# Patient Record
Sex: Female | Born: 1970 | Race: White | Hispanic: Yes | Smoking: Former smoker
Health system: Southern US, Community
[De-identification: ages and names within clinical notes are randomized; demographics above are authoritative.]

## PROBLEM LIST (undated history)

## (undated) DIAGNOSIS — K219 Gastro-esophageal reflux disease without esophagitis: Secondary | ICD-10-CM

## (undated) DIAGNOSIS — R519 Headache, unspecified: Secondary | ICD-10-CM

## (undated) DIAGNOSIS — K649 Unspecified hemorrhoids: Secondary | ICD-10-CM

## (undated) DIAGNOSIS — T8859XA Other complications of anesthesia, initial encounter: Secondary | ICD-10-CM

## (undated) DIAGNOSIS — Z8742 Personal history of other diseases of the female genital tract: Secondary | ICD-10-CM

## (undated) DIAGNOSIS — J302 Other seasonal allergic rhinitis: Secondary | ICD-10-CM

## (undated) DIAGNOSIS — R3915 Urgency of urination: Secondary | ICD-10-CM

## (undated) DIAGNOSIS — R350 Frequency of micturition: Secondary | ICD-10-CM

## (undated) DIAGNOSIS — G8929 Other chronic pain: Secondary | ICD-10-CM

## (undated) DIAGNOSIS — M545 Low back pain, unspecified: Secondary | ICD-10-CM

## (undated) DIAGNOSIS — E785 Hyperlipidemia, unspecified: Secondary | ICD-10-CM

## (undated) DIAGNOSIS — R0683 Snoring: Secondary | ICD-10-CM

## (undated) DIAGNOSIS — R51 Headache: Secondary | ICD-10-CM

## (undated) DIAGNOSIS — M5126 Other intervertebral disc displacement, lumbar region: Secondary | ICD-10-CM

## (undated) DIAGNOSIS — R918 Other nonspecific abnormal finding of lung field: Secondary | ICD-10-CM

## (undated) DIAGNOSIS — N739 Female pelvic inflammatory disease, unspecified: Secondary | ICD-10-CM

## (undated) DIAGNOSIS — T7840XA Allergy, unspecified, initial encounter: Secondary | ICD-10-CM

## (undated) DIAGNOSIS — R6882 Decreased libido: Secondary | ICD-10-CM

## (undated) DIAGNOSIS — F419 Anxiety disorder, unspecified: Secondary | ICD-10-CM

## (undated) DIAGNOSIS — N3281 Overactive bladder: Secondary | ICD-10-CM

## (undated) DIAGNOSIS — R7303 Prediabetes: Secondary | ICD-10-CM

## (undated) DIAGNOSIS — E8940 Asymptomatic postprocedural ovarian failure: Secondary | ICD-10-CM

## (undated) DIAGNOSIS — R7982 Elevated C-reactive protein (CRP): Secondary | ICD-10-CM

## (undated) HISTORY — PX: TONSILLECTOMY: SUR1361

## (undated) HISTORY — PX: SPINE SURGERY: SHX786

## (undated) HISTORY — DX: Headache, unspecified: R51.9

## (undated) HISTORY — PX: TONSILLECTOMY AND ADENOIDECTOMY: SUR1326

## (undated) HISTORY — DX: Allergy, unspecified, initial encounter: T78.40XA

## (undated) HISTORY — DX: Gastro-esophageal reflux disease without esophagitis: K21.9

## (undated) HISTORY — DX: Anxiety disorder, unspecified: F41.9

## (undated) HISTORY — DX: Other seasonal allergic rhinitis: J30.2

## (undated) HISTORY — DX: Elevated C-reactive protein (CRP): R79.82

## (undated) HISTORY — PX: BUNIONECTOMY: SHX129

## (undated) HISTORY — DX: Snoring: R06.83

## (undated) HISTORY — PX: BREAST SURGERY: SHX581

## (undated) HISTORY — PX: HEMORRHOID BANDING: SHX5850

## (undated) HISTORY — PX: ABDOMINAL ADHESION SURGERY: SHX90

## (undated) HISTORY — DX: Female pelvic inflammatory disease, unspecified: N73.9

## (undated) HISTORY — DX: Asymptomatic postprocedural ovarian failure: E89.40

## (undated) HISTORY — DX: Other intervertebral disc displacement, lumbar region: M51.26

## (undated) HISTORY — DX: Hyperlipidemia, unspecified: E78.5

## (undated) HISTORY — PX: BILATERAL SALPINGOOPHORECTOMY: SHX1223

## (undated) HISTORY — DX: Prediabetes: R73.03

## (undated) HISTORY — DX: Headache: R51

## (undated) HISTORY — DX: Decreased libido: R68.82

---

## 1988-10-16 HISTORY — PX: CERVICAL CONIZATION W/BX: SHX1330

## 1993-10-16 HISTORY — PX: EXPLORATORY LAPAROTOMY: SUR591

## 1998-04-27 ENCOUNTER — Other Ambulatory Visit: Admission: RE | Admit: 1998-04-27 | Discharge: 1998-04-27 | Payer: Self-pay | Admitting: Gynecology

## 1999-05-06 ENCOUNTER — Other Ambulatory Visit: Admission: RE | Admit: 1999-05-06 | Discharge: 1999-05-06 | Payer: Self-pay | Admitting: Gynecology

## 2000-04-20 ENCOUNTER — Other Ambulatory Visit: Admission: RE | Admit: 2000-04-20 | Discharge: 2000-04-20 | Payer: Self-pay | Admitting: Gynecology

## 2000-10-16 HISTORY — PX: BREAST ENHANCEMENT SURGERY: SHX7

## 2001-09-24 ENCOUNTER — Other Ambulatory Visit: Admission: RE | Admit: 2001-09-24 | Discharge: 2001-09-24 | Payer: Self-pay | Admitting: Gynecology

## 2002-02-11 ENCOUNTER — Encounter: Admission: RE | Admit: 2002-02-11 | Discharge: 2002-02-11 | Payer: Self-pay | Admitting: Gynecology

## 2002-02-11 ENCOUNTER — Encounter: Payer: Self-pay | Admitting: Gynecology

## 2002-03-25 ENCOUNTER — Encounter: Payer: Self-pay | Admitting: Family Medicine

## 2002-03-25 ENCOUNTER — Encounter: Admission: RE | Admit: 2002-03-25 | Discharge: 2002-03-25 | Payer: Self-pay | Admitting: Family Medicine

## 2002-04-16 ENCOUNTER — Encounter: Payer: Self-pay | Admitting: Family Medicine

## 2002-04-16 ENCOUNTER — Encounter: Admission: RE | Admit: 2002-04-16 | Discharge: 2002-04-16 | Payer: Self-pay | Admitting: Family Medicine

## 2002-06-13 ENCOUNTER — Ambulatory Visit (HOSPITAL_COMMUNITY): Admission: RE | Admit: 2002-06-13 | Discharge: 2002-06-14 | Payer: Self-pay | Admitting: Neurosurgery

## 2002-06-13 ENCOUNTER — Encounter: Payer: Self-pay | Admitting: Neurosurgery

## 2002-06-13 HISTORY — PX: LUMBAR LAMINECTOMY/DECOMPRESSION MICRODISCECTOMY: SHX5026

## 2002-09-29 ENCOUNTER — Other Ambulatory Visit: Admission: RE | Admit: 2002-09-29 | Discharge: 2002-09-29 | Payer: Self-pay | Admitting: Gynecology

## 2003-05-13 ENCOUNTER — Encounter: Admission: RE | Admit: 2003-05-13 | Discharge: 2003-05-13 | Payer: Self-pay | Admitting: Gynecology

## 2003-05-13 ENCOUNTER — Encounter: Payer: Self-pay | Admitting: Gynecology

## 2003-05-21 ENCOUNTER — Encounter: Payer: Self-pay | Admitting: Gynecology

## 2003-05-21 ENCOUNTER — Encounter: Admission: RE | Admit: 2003-05-21 | Discharge: 2003-05-21 | Payer: Self-pay | Admitting: Gynecology

## 2003-07-01 ENCOUNTER — Encounter (INDEPENDENT_AMBULATORY_CARE_PROVIDER_SITE_OTHER): Payer: Self-pay | Admitting: *Deleted

## 2003-07-01 ENCOUNTER — Observation Stay (HOSPITAL_COMMUNITY): Admission: RE | Admit: 2003-07-01 | Discharge: 2003-07-02 | Payer: Self-pay | Admitting: Gynecology

## 2003-07-01 HISTORY — PX: LAPAROTOMY: SHX154

## 2003-10-07 ENCOUNTER — Other Ambulatory Visit: Admission: RE | Admit: 2003-10-07 | Discharge: 2003-10-07 | Payer: Self-pay | Admitting: Gynecology

## 2004-01-07 ENCOUNTER — Emergency Department (HOSPITAL_COMMUNITY): Admission: EM | Admit: 2004-01-07 | Discharge: 2004-01-07 | Payer: Self-pay | Admitting: Emergency Medicine

## 2004-06-21 ENCOUNTER — Emergency Department (HOSPITAL_COMMUNITY): Admission: EM | Admit: 2004-06-21 | Discharge: 2004-06-21 | Payer: Self-pay | Admitting: Emergency Medicine

## 2004-11-07 ENCOUNTER — Other Ambulatory Visit: Admission: RE | Admit: 2004-11-07 | Discharge: 2004-11-07 | Payer: Self-pay | Admitting: Gynecology

## 2005-07-10 ENCOUNTER — Emergency Department (HOSPITAL_COMMUNITY): Admission: EM | Admit: 2005-07-10 | Discharge: 2005-07-11 | Payer: Self-pay | Admitting: Emergency Medicine

## 2005-07-13 ENCOUNTER — Encounter: Admission: RE | Admit: 2005-07-13 | Discharge: 2005-07-13 | Payer: Self-pay | Admitting: Gynecology

## 2005-10-18 ENCOUNTER — Other Ambulatory Visit: Admission: RE | Admit: 2005-10-18 | Discharge: 2005-10-18 | Payer: Self-pay | Admitting: Gynecology

## 2006-10-16 HISTORY — PX: ABDOMINAL HYSTERECTOMY: SHX81

## 2007-01-29 ENCOUNTER — Other Ambulatory Visit: Admission: RE | Admit: 2007-01-29 | Discharge: 2007-01-29 | Payer: Self-pay | Admitting: Gynecology

## 2007-06-03 ENCOUNTER — Inpatient Hospital Stay (HOSPITAL_COMMUNITY): Admission: AD | Admit: 2007-06-03 | Discharge: 2007-06-03 | Payer: Self-pay | Admitting: Gynecology

## 2007-08-26 ENCOUNTER — Encounter (INDEPENDENT_AMBULATORY_CARE_PROVIDER_SITE_OTHER): Payer: Self-pay | Admitting: Gynecology

## 2007-08-26 ENCOUNTER — Inpatient Hospital Stay (HOSPITAL_COMMUNITY): Admission: RE | Admit: 2007-08-26 | Discharge: 2007-08-27 | Payer: Self-pay | Admitting: Gynecology

## 2007-08-27 HISTORY — PX: SUPRACERVICAL ABDOMINAL HYSTERECTOMY: SHX5393

## 2009-07-27 ENCOUNTER — Encounter: Admission: RE | Admit: 2009-07-27 | Discharge: 2009-07-27 | Payer: Self-pay | Admitting: Gynecology

## 2010-10-06 ENCOUNTER — Emergency Department (HOSPITAL_COMMUNITY)
Admission: EM | Admit: 2010-10-06 | Discharge: 2010-10-06 | Payer: Self-pay | Source: Home / Self Care | Admitting: Emergency Medicine

## 2010-10-11 ENCOUNTER — Encounter: Payer: Self-pay | Admitting: Cardiology

## 2010-10-11 ENCOUNTER — Ambulatory Visit: Payer: Self-pay

## 2010-10-11 ENCOUNTER — Encounter (HOSPITAL_COMMUNITY)
Admission: RE | Admit: 2010-10-11 | Discharge: 2010-11-15 | Payer: Self-pay | Source: Home / Self Care | Attending: Internal Medicine | Admitting: Internal Medicine

## 2010-10-12 ENCOUNTER — Ambulatory Visit: Payer: Self-pay

## 2010-11-06 ENCOUNTER — Encounter: Payer: Self-pay | Admitting: Emergency Medicine

## 2010-11-17 NOTE — Assessment & Plan Note (Signed)
Summary: Cardiology Nuclear Testing  Nuclear Med Background Indications for Stress Test: Evaluation for Ischemia, Post Hospital  Indications Comments: 10/06/10- [facility]- Chest pain.  History: GXT  History Comments:  ~5 yrs ago GXT:OK per patient.  Symptoms: Chest Pain, Chest Pain with Exertion, DOE, Fatigue, Nausea  Symptoms Comments: CP>(L)arm. Last episode of GN:FAOZHYQMV.   Nuclear Pre-Procedure Cardiac Risk Factors: Lipids, Smoker Caffeine/Decaff Intake: none NPO After: 10:00 AM Lungs: Clear IV 0.9% NS with Angio Cath: 22g     IV Site: L Hand IV Started by: Cathlyn Parsons, RN Chest Size (in) 38     Cup Size D     Height (in): 62 Weight (lb): 167 BMI: 30.66  Nuclear Med Study 1 or 2 day study:  1 day     Stress Test Type:  Stress Reading MD:  Willa Rough, MD     Referring MD:  Arvilla Meres, MD Resting Radionuclide:  Technetium 73m Tetrofosmin     Resting Radionuclide Dose:  10 mCi  Stress Radionuclide:  Technetium 19m Tetrofosmin     Stress Radionuclide Dose:  33 mCi   Stress Protocol Exercise Time (min):  9:00 min     Max HR:  160 bpm     Predicted Max HR:  179 bpm  Max Systolic BP: 161 mm Hg     Percent Max HR:  89.39 %     METS: 10.4 Rate Pressure Product:  78469    Stress Test Technologist:  [employer], CMA-N     Nuclear Technologist:  Doyne Keel, CNMT  Rest Procedure  Myocardial perfusion imaging was performed at rest 45 minutes following the intravenous administration of Technetium 22m Tetrofosmin.  Stress Procedure  The patient exercised for nine minutes.  The patient stopped due to fatigue.  She c/o chest pain, 1-2/10, with exercise.  There were nonspecific ST-T wave changes and a rare PVC.  Technetium 69m Tetrofosmin was injected at peak exercise and myocardial perfusion imaging was performed after a brief delay.  QPS Raw Data Images:  Normal; no motion artifact; normal heart/lung ratio. Stress Images:  Anterior breast attenuation Rest Images:   Same as stress Subtraction (SDS):  No evidence of ischemia. Transient Ischemic Dilatation:  0.89  (Normal <1.22)  Lung/Heart Ratio:  0.27  (Normal <0.45)  Quantitative Gated Spect Images QGS EDV:  37 ml QGS ESV:  3 ml QGS EF:  93 % QGS cine images:  Motion is normal. The quantitative analysis computes a very high EF because the LV is small.  Findings Normal nuclear study      Overall Impression  Exercise Capacity: Fair exercise capacity. BP Response: Normal blood pressure response. Clinical Symptoms: chest pain ECG Impression: No significant ST segment change suggestive of ischemia. Overall Impression: Normal stress nuclear study with breast attenuation. Overall Impression Comments: The LV is small  Appended Document: Cardiology Nuclear Testing ok  Appended Document: Cardiology Nuclear Testing pt aware

## 2010-12-26 LAB — CBC
HCT: 38 % (ref 36.0–46.0)
Hemoglobin: 13.1 g/dL (ref 12.0–15.0)
MCH: 29.2 pg (ref 26.0–34.0)
MCHC: 34.5 g/dL (ref 30.0–36.0)
MCV: 84.8 fL (ref 78.0–100.0)
Platelets: 258 10*3/uL (ref 150–400)
RBC: 4.48 MIL/uL (ref 3.87–5.11)
RDW: 13.8 % (ref 11.5–15.5)
WBC: 10.8 10*3/uL — ABNORMAL HIGH (ref 4.0–10.5)

## 2010-12-26 LAB — URINALYSIS, ROUTINE W REFLEX MICROSCOPIC
Bilirubin Urine: NEGATIVE
Glucose, UA: NEGATIVE mg/dL
Hgb urine dipstick: NEGATIVE
Ketones, ur: NEGATIVE mg/dL
Nitrite: NEGATIVE
Protein, ur: NEGATIVE mg/dL
Specific Gravity, Urine: 1.025 (ref 1.005–1.030)
Urobilinogen, UA: 0.2 mg/dL (ref 0.0–1.0)
pH: 6.5 (ref 5.0–8.0)

## 2010-12-26 LAB — POCT CARDIAC MARKERS
CKMB, poc: <1 ng/mL — ABNORMAL LOW (ref 1.0–8.0)
Myoglobin, poc: 41.8 ng/mL (ref 12–200)
Troponin i, poc: 0.06 ng/mL (ref 0.00–0.09)

## 2010-12-26 LAB — BASIC METABOLIC PANEL
BUN: 8 mg/dL (ref 6–23)
CO2: 27 mEq/L (ref 19–32)
Calcium: 9 mg/dL (ref 8.4–10.5)
Chloride: 102 mEq/L (ref 96–112)
Creatinine, Ser: 0.44 mg/dL (ref 0.4–1.2)
GFR calc Af Amer: >60 mL/min (ref >60)
GFR calc non Af Amer: >60 mL/min (ref >60)
Glucose, Bld: 114 mg/dL — ABNORMAL HIGH (ref 70–99)
Potassium: 3.5 mEq/L (ref 3.5–5.1)
Sodium: 137 mEq/L (ref 135–145)

## 2010-12-26 LAB — DIFFERENTIAL
Basophils Absolute: 0 10*3/uL (ref 0.0–0.1)
Basophils Relative: 0 % (ref 0–1)
Eosinophils Absolute: 0.1 10*3/uL (ref 0.0–0.7)
Eosinophils Relative: 1 % (ref 0–5)
Lymphocytes Relative: 41 % (ref 12–46)
Lymphs Abs: 4.4 10*3/uL — ABNORMAL HIGH (ref 0.7–4.0)
Monocytes Absolute: 0.5 10*3/uL (ref 0.1–1.0)
Monocytes Relative: 5 % (ref 3–12)
Neutro Abs: 5.6 10*3/uL (ref 1.7–7.7)
Neutrophils Relative %: 53 % (ref 43–77)

## 2010-12-26 LAB — TSH: TSH: 0.766 u[IU]/mL (ref 0.350–4.500)

## 2010-12-26 LAB — T4, FREE: Free T4: 0.91 ng/dL (ref 0.80–1.80)

## 2010-12-26 LAB — BRAIN NATRIURETIC PEPTIDE: Pro B Natriuretic peptide (BNP): <30 pg/mL (ref 0.0–100.0)

## 2011-02-28 NOTE — H&P (Signed)
[name], [name]            ACCOUNT NO.:  [id number]   MEDICAL RECORD NO.:  [id number]          PATIENT TYPE:  INP   LOCATION:  NA                           FACILITY:  [facility]   PHYSICIAN:  Gretta Cool, M.D. DATE OF BIRTH:  1971/04/13   DATE OF ADMISSION:  08/26/2007  DATE OF DISCHARGE:                              HISTORY & PHYSICAL   CHIEF COMPLAINT:  Cyclic pelvic pain.   HISTORY OF PRESENT ILLNESS:  This is a 40 year old, nulligravida with  known severe pelvic inflammatory disease.  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  She has been placed on  suppressive therapy, but has failed to comply.  She has recurrence of  increasingly severe cyclic pelvic pain any time she is off suppressive  therapy.  She is now admitted for definitive therapy now that she has  made a decision not to consider IVF and advanced reproductive  technologies.  She has tried on her own without success and does not  wish to go to assisted reproductive technologies.  It appears that she  has now decided to proceed to definitive therapy.  She understands the  risks, benefits, options and alternatives to all.  She also understands  the need for hormone replacement therapy.   PAST MEDICAL HISTORY:  1. Usual childhood disease without sequelae.  2. Impaired glucose intolerance.  3. Polycystic ovary syndrome.  4. Metabolic syndrome with dyslipidemia.   She is referred to Dr. Lucianne Muss for management of her metabolic syndrome  and impaired glucose intolerance.   FAMILY HISTORY:  Mother and father both living and well.  Mom has  hypercholesterolemia.  Maternal aunt and maternal grandfather had cancer  of unknown type.   PAST SURGICAL HISTORY:  1. History of conization biopsy in 1990, by Dr. Rosalia Hammers.  2. History of right oophorocystectomy by Dr. Rosalia Hammers in 1995.  3. Bilateral breast augmentation in 2002.  4. Diagnostic laparoscopy.  5. Exploratory laparotomy.  6. Salpingo-lysis.  7. Left ovarian cystectomy.  8. Lysis of extensive adhesions in 2004.  At that time, she was      advised to begin ovarian suppressive therapy to prevent cyclic      pelvic pain.  She was also informed that she would eventually need      definitive therapy by hysterectomy.  She is now admitted for that      therapy.   REVIEW OF SYSTEMS:  HEENT:  Denies symptoms.  CARDIOPULMONARY:  Denies  asthma, cough, bronchitis, shortness of breath.  GI/GU:  Denies  frequency, dysuria, change in bowel habits and food intolerance.   PHYSICAL EXAMINATION:  GENERAL:  Well-developed, well-nourished, white  female.  She is significantly over ideal weight at 160 pounds, at 5 feet  2 inches.  VITAL SIGNS:  Blood pressure 112/68.  HEENT:  Pupils equal round and reactive to light and accommodation.  Fundi benign.  Oropharynx clear.  NECK:  Supple without mass or thyroid enlargement.  CHEST:  Clear to P&A.  BREASTS:  Bilateral augmentation without mass, nodes or nipple  discharge.  PELVIC:  External genitalia, normal female.  Vagina  clean and rugous.  Cervix is nulliparous and clean.  Uterus normal size, shape and contour.  Adnexa tender bilaterally with no dominant mass.  Rectovaginal exam  confirms.  EXTREMITIES:  Negative.  NEUROLOGIC:  Physiologic.   IMPRESSION:  Incapacitating cyclic pelvic pain secondary to pelvic  inflammatory disease with extensive pelvic adhesions and bilateral  trapped ovary syndrome.   PLAN:  Supracervical hysterectomy and possible bilateral salpingo-  oophorectomy, lysis of extensive adhesions.  She understands the  possible need for hormone replacement therapy for acute postsurgical  menopause.  She also understands the risks and benefits of the procedure  with risk of adjacent organ injury, infection, thrombosis, etc.  She is  now admitted for definitive therapy.           ______________________________  Gretta Cool, M.D.     CWL/MEDQ  D:  08/25/2007  T:   08/26/2007  Job:  [id number]   cc:   Reather Littler, M.D.  Fax: [phone]

## 2011-02-28 NOTE — Op Note (Signed)
NAMESEVANA, [name]            ACCOUNT NO.:  [id number]   MEDICAL RECORD NO.:  [id number]          PATIENT TYPE:  INP   LOCATION:  1534                         FACILITY:  [facility]   PHYSICIAN:  Gretta Cool, M.D. DATE OF BIRTH:  1971/06/08   DATE OF PROCEDURE:  DATE OF DISCHARGE:  08/27/2007                               OPERATIVE REPORT   PREOPERATIVE DIAGNOSES:  1. 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  2. Multiple conservative surgeries for extensive pelvic inflammatory      disease.   POSTOPERATIVE DIAGNOSES:  1. 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  2. Multiple conservative surgeries for extensive pelvic inflammatory      disease.   PROCEDURE:  Super cervical hysterectomy, left salpingo-oophorectomy,  lysis of extensive adhesions, revision of her multiple abdominal scars.   SURGEON:  Gretta Cool, M.D.   ASSISTANT:  Almedia Balls. Randell Patient, M.D.   ANESTHESIA:  General orotracheal.   DESCRIPTION OF PROCEDURE:  The patient was prepped and draped in the  lithotomy position.  An incision was begun by excision of her previous  Pfannenstiel incisions.  A wide resection around the incisions was made,  and the intervening scar excised.  The resection was carried down to the  level of Scarpa's fascia.  At this point, the incision was further  incised to the fascia.  At this point, the fascia was opened, and the  rectus muscles separated in the midline.  At this point, the peritoneum  was identified and opened sharply.  The peritoneum was then opened  fully, and the abdomen explored.  There were no abnormalities notified  in the upper abdomen.  Examination of the pelvis, however, revealed  extensive adhesions of the fallopian tube and ovary to the omentum and  to the lateral pelvic wall.  These adhesions were lysed by blunt and  sharp dissection.  The cautery on cut was used to lyse most of the  adhesions and to mobilize the ovary from its  attachments, then lift it  up out of the pelvis.  At this point, the adnexal structures were  clamped across with [facility] clamps, and the uterus and tubes elevated.  The round ligaments were then transected by cautery.  The anterior  length of the broad ligament was then opened on each side.  On the left,  the infundibulopelvic vessels were isolated from the ureter, clamped,  cut, sutured, and tied with 0 Vicryl.  The uterine vessels on each side  were then skeletonized, clamped, cut, sutured, and tied with 0 Vicryl.  On the right, a further pedicle was taken, including the upper portion  of the cardinal ligament.  At this point, the cervix was incised in a  reverse cone fashion, so as to remove virtually all of the endocervical  canal.  Bleeding points were cauterized.  The cervix was then closed  with a running suture of 0 Vicryl.  At this point, the pelvis was dry.  The pelvic floors were reperitonealized with a running suture of 2-0  Monocryl.  At this point, irrigation was used to remove clot and debris.  There was no residual bleeding.  At this point, closure of the abdominal  wall was undertaken.  The peritoneum was closed with a running suture of  0 Monocryl.  The posterior sheath, rectus sheath, and the rectus muscles  were then plicated in the midline with a running suture of 0 Monocryl as  well.  At this point, the fascia was approximated centrally with  interrupted suture of 0 Vicryl and then closed from each angle to the  midline with a running suture of 0 Vicryl.  To the right of the midline,  there was noted to be a significant fascial weakness, apparently from  some separation of the fascia from previous procedures.  The fascia was  secured to the fascia still attached to the pubic  arch.  At this point, the subcutaneous tissues were approximated with  interrupted sutures of 3-0 Vicryl, and the skin closed with skin staples  and Steri-Strips.  At the end of the procedure,  sponge and lap counts  were correct.  There were no complications.  Patient returned to the  recovery room in excellent condition.           ______________________________  Gretta Cool, M.D.     CWL/MEDQ  D:  08/26/2007  T:  08/27/2007  Job:  [id number]   cc:   Almedia Balls. Randell Patient, M.D.  Fax: [phone]

## 2011-03-03 NOTE — Op Note (Signed)
NAMELESLEYANNE, POLITTE                      ACCOUNT NO.:  [id number]   MEDICAL RECORD NO.:  [id number]                   PATIENT TYPE:  OBV   LOCATION:  0445                                 [facility]ACILITY:  [facility]   PHYSICIAN:  Gretta Cool, M.D.              DATE OF BIRTH:  December 09, 1970   DATE OF PROCEDURE:  07/01/2003  DATE OF DISCHARGE:                                 OPERATIVE REPORT   PREOPERATIVE DIAGNOSES:  1. Left adnexal mass; complex, persistent, solid, and cystic areas     suspicious for neoplasm.  2. Previous history of right ovarian serous cystadenoma in 1995.  3. Cyclic pelvic pain.  4. Abnormal uterine bleeding.   POSTOPERATIVE DIAGNOSIS:  Trapped left ovary with severe tubal adhesions  bilateral and complete atrophy of her right ovary with no visible evidence  of persisting right ovary.  Severe scaring left ovary and fallopian tubes.   SURGEON:  Gretta Cool, M.D.   ASSISTANT:  Raynald Kemp, M.D.   ANESTHESIA:  General orotracheal.   DESCRIPTION OF PROCEDURE:  Under excellent general anesthesia with the  patient's abdomen prepped and draped as a sterile field in Brashear stirrups  with a tubal dye injection device attached to her cervix.  A subumbilical  incision was made and Veress cannula introduced after adequate  pneumoperitoneum with carbon dioxide.  The laparoscope trocar was introduced  and pelvic organs visualized.  There was significant adhesion of the cecum  to the anterior abdominal wall above the right ovary. Omental adhesions were  also present.  Examination of the fallopian tube and ovary on the right  revealed no evidence of residual ovarian tissue and complete phimosis and  fixation of the distal fimbriated end of the fallopian tube to the pelvic  sidewall.  On the left there was a complete obliteration of the fallopian  tube opening and a tuboovarian complex formation.  There was no visible  fimbriated end that was plastered to the ovary.   The entire ovary was  covered by fallopian tube and mesentery.  There was no evidence of recent or  remote ovulation and very little ovarian surface that was not covered by  fallopian tube.  There was no significant evidence of endometriosis or other  pathology.  The adhesions were so dense that it was very difficult to  mobilize the ovary on the left at all.  Because of the severity of adhesions  decision was made to proceed to laparotomy for definitive diagnosis of the  ovarian pathology and for removal without rupture of the mass.   At this point a Pfannenstiel incision was made by excision of her previous  scar.  Excision was extended through the fascia.  The peritoneum was opened  and the abdomen explored.  The findings noted by laparoscopy were confirmed.  The adhesions of the cecum to the anterior abdominal wall were excised.  Examination of the left ovary and tube  revealed advanced adhesion of the  fallopian tube over the entire surface of the ovary except for a small  portion medially.  At this point decision was made to dissect the fallopian  tube free so as to free the surface of the ovary.  Dissection was carried  out by blunt and sharp dissection and by needle cautery.  The ovary had  multiple loculations and was quite enlarged to approximately 4 x 7 cm.  There was evidence of recent hemorrhage and a corpus luteum cyst in several  areas and evidence of trapped ovary function with failure of the eggs to  release from the ovary itself.  The fallopian tube was very densely  adherent.  It was dissected off with considerable difficulty because of the  severity and density of adhesions.  Once the fallopian tube was mobilized  the ovarian cysts were shelled out after incision of the ovarian capsule.  All of the multiple ovarian cysts seemed to be corpus luteum cyst with  hemorrhage inside and organization of the clot; three or four such  structures were identified and evacuated.   There was no other evidence of  ovarian pathology once those cystic structures were excised.  The ovary was  then repaired with a running subcuticular closure of 5-0 Vicryl.  The  mesosalpinx was then pulled across the raw surface on the fallopian tube  which had been removed from the surface of the ovary.  The omentum was  placed between the ovary and fallopian tube in hopes of preventing re-  adhesion.  At this point the pelvis was irrigated to remove all debris.  The  abdominal peritoneum was then closed with a running suture of 0 Monocryl.  The rectus muscle was then plicated in the midline with a running suture of  0 Monocryl.  The fascia was then approximated from each angle to the midline  with a running suture of 0 Vicryl.  The subcu was approximated with  interrupted sutures of 3-0 Vicryl and the skin closed with skin staples and  Steri-Strips.  At the end of the procedure, sponge and lap counts were  correct and no complications.  The patient returned to the recovery room in  excellent condition.                                               Gretta Cool, M.D.    CWL/MEDQ  D:  07/01/2003  T:  07/01/2003  Job:  [id number]

## 2011-03-03 NOTE — H&P (Signed)
[name], [name]                      ACCOUNT NO.:  [id number]   MEDICAL RECORD NO.:  [id number]                   PATIENT TYPE:  AMB   LOCATION:  DAY                                  FACILITY:  [facility]   PHYSICIAN:  Gretta Cool, M.D.              DATE OF BIRTH:  [birth year]   DATE OF ADMISSION:  07/01/2003  DATE OF DISCHARGE:                                HISTORY & PHYSICAL   CHIEF COMPLAINT:  1. Left adnexal mass, complex in nature.  2. Abnormal uterine bleeding.   HISTORY OF PRESENT ILLNESS:  [name] is a 40 year old nulligravida with  history of a right ovarian cyst operated by Raynald Kemp, M.D. January  1995.  She had a right ovarian serous cystadenoma resected by exploratory  laparotomy.  In February of this year she presented with a left adnexal  cystic complex structure that was quite small.  She was placed on Aygestin  for control of abnormal uterine bleeding and to follow up in six to eight  weeks' time.  She failed follow-up until the last month when she was noted  to have further enlargement of the complex left adnexal cystic structure  suspicious of an ovarian endometrioma.  She had CA-125 measured that was  16.9.  She is now admitted for diagnostic laparoscopy, possible laparotomy  for definitive therapy for this complex adnexal structure.  She wishes to  preserve fertility, wishes testing of her fallopian tube function as well  because of her previous surgery.  She is not certain whether she wishes to  have a family or not, but wishes to know if her tube function remains  present.  I have discussed all therapy options with her and that she must  proceed to evaluation of this because of its persistence and increase in  size over time and that she should not further delay.  She is now admitted  for definitive therapy.   PAST MEDICAL HISTORY:  Usual childhood disease without sequelae.  Medical  illnesses:  None.   PAST SURGICAL HISTORY:  1. 1990  for abnormal cytology, Raynald Kemp, M.D.  2. 1995 for ovarian cyst, Raynald Kemp, M.D.   ALLERGIES:  None known.   CURRENT MEDICATIONS:  1. Ibuprofen.  2. Vicodin as needed for pain.  3. Lexapro for depression.  4. Intermittent Claritin therapy for seasonal allergy.   FAMILY HISTORY:  Father and mother living and well.  She has no siblings.  Mom has cholesterol elevation.  No other known familial tendency.   REVIEW OF SYSTEMS:  HEENT:  Denies symptoms.  CARDIORESPIRATORY:  Denies  asthma, cough, bronchitis, shortness of breath.  GASTROINTESTINAL/GENITOURINARY:  Denies frequency, urgency, dysuria, change  in bowel habits, food intolerance.   PHYSICAL EXAMINATION:  GENERAL:  Well-developed, well-nourished white female  moderately over ideal weight with high abdomen to hip ratio.  HEENT:  Pupils equal and react to light and accommodate.  Fundi not  examined.  Oropharynx clear.  NECK:  Supple without mass, thyroid enlargement.  CHEST:  Clear P to A.  HEART:  Regular rhythm without murmur or cardiac enlargement.  BREASTS:  Without mass, nodes, nipple discharge.  ABDOMEN:  Soft, scaphoid without mass or organomegaly.  PELVIC:  External genitalia.  Vagina clean, rugose.  Cervix is nulliparous.  The left adnexal area is enlarged and difficult to outline because of her  abdominal wall thickness.  Ultrasound examination reveals complex cystic  structure with some solid component suspicious of endometrioma, rule out  more.  Right adnexa is nonpalpable.  Rectovaginal examination confirms.   IMPRESSION:  1. Left adnexal mass with history of right ovarian cystadenoma.  2. Dysfunctional uterine bleeding.  3. Depression.   RECOMMENDATIONS:  On to diagnostic laparoscopy, definitive therapy if  possible, laparotomy if needed to effectively treat this and preserve  fertility.                                               Gretta Cool, M.D.    CWL/MEDQ  D:  07/01/2003  T:   07/01/2003  Job:  [id number]

## 2011-03-03 NOTE — Op Note (Signed)
NAME:  [name], [name]                      ACCOUNT NO.:  [id number]   MEDICAL RECORD NO.:  [id number]                   PATIENT TYPE:  OIB   LOCATION:  3006                                 FACILITY:  [facility]   PHYSICIAN:  Danae Orleans. Venetia Maxon, M.D.               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   PROCEDURE:  Bilateral laminotomies L5-S1 with left L5-S1 microdiscectomy  with microdissection.   SURGEON:  Danae Orleans. Venetia Maxon, M.D.   ASSISTANT:   ANESTHESIA:  General endotracheal anesthesia.   ESTIMATED BLOOD LOSS:  Less than 50 cc.   COMPLICATIONS:  None.   DISPOSITION:  Recovery room.   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  She has a large central disc herniation and  significant canal stenosis at the L5-S1 level.  She has a central annular  tear at L4-L5 without nerve root compression.  It was elected to take her to  surgery for bilateral laminotomies and microdiscectomy at L5-S1 on the left.   PROCEDURE:  [name] was brought to the operating room.  Following  satisfactory uncomplicated induction of general endotracheal anesthesia and  insertion of intravenous  lines, the patient was placed in a prone position  on the Wilson frame.  Her low back was then prepped and draped in the usual  sterile fashion.  The area of planned incision was infiltrated with 0.25%  Marcaine and 0.5% lidocaine with 1:200,000 epinephrine.  An incision was  made in the midline and carried through copious adipost tissue.  The  lumbodorsal fascia was incised bilaterally.   Subperiosteal dissection was  then performed exposing  the L5-S1 interspace bilaterally.  Self-retaining  retractor was placed.  An interoperative x-ray confirmed the level.  Using  the Anspach drill and bur, hemilaminotomies of L5 were then performed and  then foraminotomies were performed overlying the top of the sacral laminae  with Kerrison rongeurs.  The laminotomies were completed with Kerrison  rongeurs.  The ligamentum flavum was detached and removed in a piecemeal  fashion.  Initially on the left, the microscope was brought onto the field  and under microscopic visualization, the S1 nerve root and common dural tube  were mobilized medially exposing the very large central to left sided disc  herniation.  The annulus was incised with a 15 blade and this material was  removed in a piecemeal fashion.  The central aspect of the spinal canal was  decompressed as well as the lateral recess.  Endplates were stripped of  residual disc material and osteophytes were taken down  overlying the sacrum.  It was felt that the S1 nerve root and common dural tube were well  decompressed after that.  The attention was turned to the right side where  the spinal canal was palpated and using microdissection technique, the S1  nerve root was mobilized.  It was felt that the spinal canal was  significantly decompressed and the lateral ligamentous tissue was removed to  decompress the S1 nerve root.  The coronary dilator was easily entered in  the neural foramen and it was felt that further discectomy was not necessary  on the right side.  The wound was then copiously irrigated with Bacitracin  saline.  The self-retaining retractor was removed, the microscope was taken  off the field.  The lumbodorsal fascia was reapproximated with 0 Vicryl  sutures, the subcutaneous tissues were reapproximated with 2-0 Vicryl  interrupted inverted sutures, and the skin edges were reapproximated with  interrupted 3-0 Vicryl  subcuticular tissue.  The wound was dressed with  Dermabond.  The patient was extubated in the operating room and taken to the  recovery room in stable condition having tolerated the operation well.  Counts were correct at the end of the case.                                               Danae Orleans. Venetia Maxon, M.D.    JDS/MEDQ  D:  06/13/2002  T:  06/16/2002  Job:  [id number]

## 2011-07-25 LAB — HEMOGLOBIN AND HEMATOCRIT, BLOOD
HCT: 36
HCT: 37
Hemoglobin: 12
Hemoglobin: 12.4

## 2011-07-25 LAB — PREGNANCY, URINE: Preg Test, Ur: NEGATIVE

## 2011-07-28 LAB — URINALYSIS, ROUTINE W REFLEX MICROSCOPIC
Bilirubin Urine: NEGATIVE
Glucose, UA: NEGATIVE
Ketones, ur: NEGATIVE
Leukocytes, UA: NEGATIVE
Nitrite: NEGATIVE
Protein, ur: NEGATIVE
Specific Gravity, Urine: <1.005 — ABNORMAL LOW
Urobilinogen, UA: 0.2
pH: 5.5

## 2011-07-28 LAB — URINE MICROSCOPIC-ADD ON

## 2011-07-28 LAB — COMPREHENSIVE METABOLIC PANEL
ALT: 13
AST: 24
Albumin: 3.9
Alkaline Phosphatase: 71
BUN: 6
CO2: 26
Calcium: 9.4
Chloride: 101
Creatinine, Ser: 0.62
GFR calc Af Amer: >60
GFR calc non Af Amer: >60
Glucose, Bld: 106 — ABNORMAL HIGH
Potassium: 4.2
Sodium: 135
Total Bilirubin: 0.3
Total Protein: 7.5

## 2011-07-28 LAB — HCG, QUANTITATIVE, PREGNANCY: hCG, Beta Chain, Quant, S: <2

## 2011-07-28 LAB — CBC
HCT: 36.7
Hemoglobin: 12.7
MCHC: 34.7
MCV: 84.3
Platelets: 315
RBC: 4.36
RDW: 13.8
WBC: 14.7 — ABNORMAL HIGH

## 2011-07-28 LAB — GC/CHLAMYDIA PROBE AMP, GENITAL
Chlamydia, DNA Probe: NEGATIVE
GC Probe Amp, Genital: NEGATIVE

## 2011-07-28 LAB — WET PREP, GENITAL
Clue Cells Wet Prep HPF POC: NONE SEEN
Trich, Wet Prep: NONE SEEN
Yeast Wet Prep HPF POC: NONE SEEN

## 2011-07-28 LAB — DIFFERENTIAL
Basophils Absolute: 0
Basophils Relative: 0
Eosinophils Absolute: 0.1
Eosinophils Relative: 1
Lymphocytes Relative: 42
Lymphs Abs: 6.2 — ABNORMAL HIGH
Monocytes Absolute: 0.6
Monocytes Relative: 4
Neutro Abs: 7.8 — ABNORMAL HIGH
Neutrophils Relative %: 53

## 2011-09-05 ENCOUNTER — Other Ambulatory Visit: Payer: Self-pay | Admitting: Gynecology

## 2011-09-05 DIAGNOSIS — Z1231 Encounter for screening mammogram for malignant neoplasm of breast: Secondary | ICD-10-CM

## 2011-09-27 ENCOUNTER — Other Ambulatory Visit: Payer: Self-pay | Admitting: Gynecology

## 2011-09-27 ENCOUNTER — Ambulatory Visit
Admission: RE | Admit: 2011-09-27 | Discharge: 2011-09-27 | Disposition: A | Discharge status: Home / Self Care | Payer: BC Managed Care – PPO | Source: Ambulatory Visit | Attending: Gynecology | Admitting: Gynecology

## 2011-09-27 DIAGNOSIS — Z1231 Encounter for screening mammogram for malignant neoplasm of breast: Secondary | ICD-10-CM

## 2011-10-19 ENCOUNTER — Other Ambulatory Visit: Payer: Self-pay | Admitting: Gynecology

## 2012-03-18 ENCOUNTER — Ambulatory Visit (INDEPENDENT_AMBULATORY_CARE_PROVIDER_SITE_OTHER): Payer: BC Managed Care – PPO | Admitting: Family Medicine

## 2012-03-18 ENCOUNTER — Telehealth: Payer: Self-pay | Admitting: Radiology

## 2012-03-18 VITALS — BP 120/81 | HR 98 | Temp 98.3°F | Resp 16 | Ht 61.25 in | Wt 167.4 lb

## 2012-03-18 DIAGNOSIS — G8929 Other chronic pain: Secondary | ICD-10-CM

## 2012-03-18 DIAGNOSIS — G4700 Insomnia, unspecified: Secondary | ICD-10-CM

## 2012-03-18 DIAGNOSIS — R519 Headache, unspecified: Secondary | ICD-10-CM

## 2012-03-18 DIAGNOSIS — M436 Torticollis: Secondary | ICD-10-CM

## 2012-03-18 DIAGNOSIS — R51 Headache: Secondary | ICD-10-CM

## 2012-03-18 DIAGNOSIS — R21 Rash and other nonspecific skin eruption: Secondary | ICD-10-CM

## 2012-03-18 MED ORDER — CYCLOBENZAPRINE HCL 5 MG PO TABS: ORAL_TABLET | ORAL | Status: DC

## 2012-03-18 MED ORDER — PREDNISONE 20 MG PO TABS: 40.0000 mg | ORAL_TABLET | Freq: Every day | ORAL | Status: AC

## 2012-03-18 NOTE — Progress Notes (Signed)
[name] comes in with headaches for 2 weeks and sees onset May 17 after sleeping in a different bed when visiting her daughter. The pain actually began in her neck and now the headaches gotten much more severe. She says that she now has significant insomnia as well.  She has no fever but she does have mild nausea.  Patient also notes a insect bite of one week's duration on the right auricle which he first noticed when she was checking in hearing. She did not see a tick or spider is a persistent papule on the spur aspect of the right auricle.  Objective: No acute distress  Neck: Supple without adenopathy, full range of motion HEENT: Normal fundi normal TMs, normal oropharynx, right auricle shows 2 mm papule.  Neurological: Patient is alert, cranial nerves III through XII intact, normal gait, normal motor  Assessment: Acute torticollis, small insignificant insecticide on right auricle without other signs of constitutional stiffness.  Plan: Flexeril at night, prednisone 2 daily with food x3, patient to call if symptoms persist

## 2012-05-19 ENCOUNTER — Ambulatory Visit: Payer: BC Managed Care – PPO

## 2012-05-19 ENCOUNTER — Ambulatory Visit (INDEPENDENT_AMBULATORY_CARE_PROVIDER_SITE_OTHER): Payer: BC Managed Care – PPO | Admitting: Family Medicine

## 2012-05-19 VITALS — BP 118/80 | HR 92 | Temp 98.4°F | Resp 16 | Ht 61.0 in | Wt 170.0 lb

## 2012-05-19 DIAGNOSIS — R5381 Other malaise: Secondary | ICD-10-CM

## 2012-05-19 DIAGNOSIS — R05 Cough: Secondary | ICD-10-CM

## 2012-05-19 DIAGNOSIS — M542 Cervicalgia: Secondary | ICD-10-CM

## 2012-05-19 DIAGNOSIS — R5383 Other fatigue: Secondary | ICD-10-CM

## 2012-05-19 DIAGNOSIS — R059 Cough, unspecified: Secondary | ICD-10-CM

## 2012-05-19 LAB — POCT CBC
Granulocyte percent: 52.4 %G (ref 37–80)
HCT, POC: 41.4 % (ref 37.7–47.9)
Hemoglobin: 12.5 g/dL (ref 12.2–16.2)
Lymph, poc: 5.4 — AB (ref 0.6–3.4)
MCH, POC: 26.9 pg — AB (ref 27–31.2)
MCHC: 30.2 g/dL — AB (ref 31.8–35.4)
MCV: 89.1 fL (ref 80–97)
MID (cbc): 0.7 (ref 0–0.9)
MPV: 7.9 fL (ref 0–99.8)
POC Granulocyte: 6.7 (ref 2–6.9)
POC LYMPH PERCENT: 42.5 %L (ref 10–50)
POC MID %: 5.1 %M (ref 0–12)
Platelet Count, POC: 369 10*3/uL (ref 142–424)
RBC: 4.65 M/uL (ref 4.04–5.48)
RDW, POC: 14 %
WBC: 12.8 10*3/uL — AB (ref 4.6–10.2)

## 2012-05-19 LAB — POCT SEDIMENTATION RATE: POCT SED RATE: 48 mm/hr — AB (ref 0–22)

## 2012-05-19 MED ORDER — CEFDINIR 300 MG PO CAPS: 300.0000 mg | ORAL_CAPSULE | Freq: Two times a day (BID) | ORAL | Status: AC

## 2012-05-19 NOTE — Progress Notes (Signed)
[facility] [address], [place] [address] [phone]  Date:  05/19/2012   Name:  [name]   DOB:  11-May-1971   MRN:  [id number]  PCP:  Gretta Cool, MD    Chief Complaint: Torticollis and Fatigue   History of Present Illness:  [name] is a 41 y.o. very pleasant female patient who presents with the following:  She was here in June- she had torticollis and was treated with prednisone.  See OV 03/18/12.  She just took 3 days of prednisone but did notice an improvement in her symptoms temporarily.  However, her symptoms have now come back. She still has the same problem with her neck; it seems to feel stiff, like she cannot turn it as easily as ususal.  [name] also notes "flu- like symptoms" which consist of a temperature up to around 100 degrees, fatigue, headaches, feeling disoriented at times, and some URI symptoms.  Earlier this week she had congestion and a productive cough.  Congestion, cough and mucus are better, but she continues to note fatigue.    She also had noted an insect bite near her right ear in June- it has healed.  She has not traveled [place] to [place] areas.    She had some amoxicillin left over and has been taking 500mg  BID for the last week S/p hysterectomy  There is no problem list on file for this patient.   No past medical history on file.  No past surgical history on file.  History  Substance Use Topics  . Smoking status: Current Everyday Smoker -- 0.4 packs/day    Types: Cigarettes  . Smokeless tobacco: Not on file  . Alcohol Use: Not on file    No family history on file.  Allergies  Allergen Reactions  . Codeine Swelling    Hard to swollow    Medication list has been reviewed and updated.  Current Outpatient Prescriptions on File Prior to Visit  Medication Sig Dispense Refill  . cyclobenzaprine (FLEXERIL) 5 MG tablet 1 qhs prn neck stiffness  30 tablet  0  . estradiol (VIVELLE-DOT)  0.075 MG/24HR Place 1 patch onto the skin 2 (two) times a week.      Marland Kitchen HYDROcodone-acetaminophen (NORCO) 10-325 MG per tablet Take 1 tablet by mouth every 6 (six) hours as needed.      . loratadine (CLARITIN) 10 MG tablet Take 10 mg by mouth daily.      Marland Kitchen omega-3 acid ethyl esters (LOVAZA) 1 G capsule Take 2 g by mouth 2 (two) times daily.      Marland Kitchen venlafaxine (EFFEXOR) 75 MG tablet Take 75 mg by mouth 2 (two) times daily.        Review of Systems:  As per HPI- otherwise negative.   Physical Examination: Filed Vitals:   05/19/12 1630  BP: 118/80  Pulse: 92  Temp: 98.4 F (36.9 C)  Resp: 16   Filed Vitals:   05/19/12 1630  Height: 5\' 1"  (1.549 m)  Weight: 170 lb (77.111 kg)   Body mass index is 32.12 kg/(m^2). Ideal Body Weight: Weight in (lb) to have BMI = 25: 132   GEN: WDWN, NAD, Non-toxic, A & O x 3, overweight HEENT: Atraumatic, Normocephalic. Neck supple. No masses.  TM and oropharynx wnl.  No cervical LAD noted.  Cervical ROM slightly restricted to rotation bilaterally, flexion and extension are ok.  No evidence of persistent wound or bite on ear.  PEERL,  EOMI Ears and Nose: No external deformity. CV: RRR, No M/G/R. No JVD. No thrill. No extra heart sounds. PULM: CTA B, no wheezes, crackles, rhonchi. No retractions. No resp. distress. No accessory muscle use. ABD: S, NT, ND, +BS. No rebound. No HSM. EXTR: No c/c/e. No rash.  No other joint problems NEURO Normal gait.  PSYCH: Normally interactive. Conversant. Not depressed or anxious appearing.  Calm demeanor.    [facility] reading (PRIMARY) by  Dr. Patsy Lager.  Negative c- spine  CERVICAL SPINE - COMPLETE 4+ VIEW  Comparison: None.  Findings: Straightening of the normal cervical lordosis. No fracture. Prevertebral soft tissues are normal. Intervertebral disc spaces appear preserved. No foraminal stenosis. Odontoid normal.  IMPRESSION: Negative.   Results for orders placed in visit on 05/19/12  POCT CBC       Component Value Range   WBC 12.8 (*) 4.6 - 10.2 K/uL   Lymph, poc 5.4 (*) 0.6 - 3.4   POC LYMPH PERCENT 42.5  10 - 50 %L   MID (cbc) 0.7  0 - 0.9   POC MID % 5.1  0 - 12 %M   POC Granulocyte 6.7  2 - 6.9   Granulocyte percent 52.4  37 - 80 %G   RBC 4.65  4.04 - 5.48 M/uL   Hemoglobin 12.5  12.2 - 16.2 g/dL   HCT, POC 16.1  09.6 - 47.9 %   MCV 89.1  80 - 97 fL   MCH, POC 26.9 (*) 27 - 31.2 pg   MCHC 30.2 (*) 31.8 - 35.4 g/dL   RDW, POC 04.5     Platelet Count, POC 369  142 - 424 K/uL   MPV 7.9  0 - 99.8 fL  POCT SEDIMENTATION RATE      Component Value Range   POCT SED RATE 48 (*) 0 - 22 mm/hr     Assessment and Plan: 1. Fatigue  POCT CBC, Comprehensive metabolic panel, TSH, Epstein-Barr virus VCA antibody panel  2. Neck pain  POCT SEDIMENTATION RATE, Rheumatoid factor, ANA, DG Cervical Spine Complete  3. Cough  cefdinir (OMNICEF) 300 MG capsule   Sheriann has noted malaise for a couple of months, and now also notes cough and congestion.  Her WBC count is elevated.  She had started to note some improvement with amoxicillin last week, so will try treating her with omnicef.  Otherwise await further labs.  DDX includes bacterial URI/ bronchitis, autoimmune disease, viral illness, less likely tick- borne illness.   Note elevated ESR with other labs  COPLAND,JESSICA, MD

## 2012-05-20 LAB — COMPREHENSIVE METABOLIC PANEL
ALT: 20 U/L (ref 0–35)
AST: 31 U/L (ref 0–37)
Albumin: 4.1 g/dL (ref 3.5–5.2)
Alkaline Phosphatase: 68 U/L (ref 39–117)
BUN: 9 mg/dL (ref 6–23)
CO2: 27 mEq/L (ref 19–32)
Calcium: 9.6 mg/dL (ref 8.4–10.5)
Chloride: 101 mEq/L (ref 96–112)
Creat: 0.63 mg/dL (ref 0.50–1.10)
Glucose, Bld: 108 mg/dL — ABNORMAL HIGH (ref 70–99)
Potassium: 3.5 mEq/L (ref 3.5–5.3)
Sodium: 140 mEq/L (ref 135–145)
Total Bilirubin: 0.2 mg/dL — ABNORMAL LOW (ref 0.3–1.2)
Total Protein: 7.1 g/dL (ref 6.0–8.3)

## 2012-05-20 LAB — RHEUMATOID FACTOR: Rhuematoid fact SerPl-aCnc: <10 IU/mL (ref <=14)

## 2012-05-20 LAB — TSH: TSH: 0.9 u[IU]/mL (ref 0.350–4.500)

## 2012-05-21 LAB — ANA: Anti Nuclear Antibody(ANA): NEGATIVE

## 2012-05-22 LAB — EPSTEIN-BARR VIRUS VCA ANTIBODY PANEL
EBV EA IgG: 31.1 U/mL — ABNORMAL HIGH (ref <9.0)
EBV NA IgG: 108 U/mL — ABNORMAL HIGH (ref <18.0)
EBV VCA IgG: 382 U/mL — ABNORMAL HIGH (ref <18.0)
EBV VCA IgM: 27.3 U/mL (ref <36.0)

## 2012-05-22 NOTE — Addendum Note (Signed)
Addended by: Abbe Amsterdam C on: 05/22/2012 05:40 PM   Modules accepted: Orders

## 2012-05-22 NOTE — Addendum Note (Signed)
Addended by: Abbe Amsterdam C on: 05/22/2012 05:42 PM   Modules accepted: Orders

## 2012-05-31 ENCOUNTER — Other Ambulatory Visit (INDEPENDENT_AMBULATORY_CARE_PROVIDER_SITE_OTHER): Payer: BC Managed Care – PPO | Admitting: Family Medicine

## 2012-05-31 VITALS — BP 125/78 | HR 94 | Temp 98.0°F | Resp 20 | Ht 61.0 in | Wt 173.0 lb

## 2012-05-31 DIAGNOSIS — R5381 Other malaise: Secondary | ICD-10-CM

## 2012-05-31 DIAGNOSIS — R5383 Other fatigue: Secondary | ICD-10-CM

## 2012-05-31 LAB — POCT GLYCOSYLATED HEMOGLOBIN (HGB A1C): Hemoglobin A1C: 5.9

## 2012-05-31 LAB — POCT CBC
Granulocyte percent: 51 %G (ref 37–80)
HCT, POC: 40.9 % (ref 37.7–47.9)
Hemoglobin: 12.5 g/dL (ref 12.2–16.2)
Lymph, poc: 4.6 — AB (ref 0.6–3.4)
MCH, POC: 27.1 pg (ref 27–31.2)
MCHC: 30.6 g/dL — AB (ref 31.8–35.4)
MCV: 88.5 fL (ref 80–97)
MID (cbc): 0.6 (ref 0–0.9)
MPV: 8.3 fL (ref 0–99.8)
POC Granulocyte: 5.4 (ref 2–6.9)
POC LYMPH PERCENT: 43.7 %L (ref 10–50)
POC MID %: 5.3 %M (ref 0–12)
Platelet Count, POC: 355 10*3/uL (ref 142–424)
RBC: 4.62 M/uL (ref 4.04–5.48)
RDW, POC: 14.3 %
WBC: 10.5 10*3/uL — AB (ref 4.6–10.2)

## 2012-05-31 LAB — POCT SEDIMENTATION RATE: POCT SED RATE: 50 mm/hr — AB (ref 0–22)

## 2012-05-31 LAB — POCT SKIN KOH: Skin KOH, POC: NEGATIVE

## 2012-05-31 NOTE — Progress Notes (Unsigned)
[facility] [address], [place] [address] [phone]  Date:  05/31/2012   Name:  [name]   DOB:  1971-02-08   MRN:  [id number]  PCP:  Gretta Cool, MD    Chief Complaint: No chief complaint on file.   History of Present Illness:  [name] is a 41 y.o. very pleasant female patient who presents with the following:  Here today to follow- up BW.  However, she also notes a scaly rash on the back of her right arm, and a place on her left ankle.  It does not itch.    There is no problem list on file for this patient.   No past medical history on file.  No past surgical history on file.  History  Substance Use Topics  . Smoking status: Current Everyday Smoker -- 0.4 packs/day    Types: Cigarettes  . Smokeless tobacco: Not on file  . Alcohol Use: Not on file    No family history on file.  Allergies  Allergen Reactions  . Codeine Swelling    Hard to swollow    Medication list has been reviewed and updated.  Current Outpatient Prescriptions on File Prior to Visit  Medication Sig Dispense Refill  . cyclobenzaprine (FLEXERIL) 5 MG tablet 1 qhs prn neck stiffness  30 tablet  0  . estradiol (VIVELLE-DOT) 0.075 MG/24HR Place 1 patch onto the skin 2 (two) times a week.      Marland Kitchen HYDROcodone-acetaminophen (NORCO) 10-325 MG per tablet Take 1 tablet by mouth every 6 (six) hours as needed.      . loratadine (CLARITIN) 10 MG tablet Take 10 mg by mouth daily.      Marland Kitchen omega-3 acid ethyl esters (LOVAZA) 1 G capsule Take 2 g by mouth 2 (two) times daily.      Marland Kitchen venlafaxine (EFFEXOR) 75 MG tablet Take 75 mg by mouth 2 (two) times daily.        Review of Systems:  ***  Physical Examination: Filed Vitals:   05/31/12 1539  BP: 125/78  Pulse: 94  Temp: 98 F (36.7 C)  Resp: 20   Filed Vitals:   05/31/12 1539  Height: 5\' 1"  (1.549 m)  Weight: 173 lb (78.472 kg)   Body mass index is 32.69 kg/(m^2). Ideal Body Weight: Weight in  (lb) to have BMI = 25: 132   *** Results for orders placed in visit on 05/31/12  POCT CBC      Component Value Range   WBC 10.5 (*) 4.6 - 10.2 K/uL   Lymph, poc 4.6 (*) 0.6 - 3.4   POC LYMPH PERCENT 43.7  10 - 50 %L   MID (cbc) 0.6  0 - 0.9   POC MID % 5.3  0 - 12 %M   POC Granulocyte 5.4  2 - 6.9   Granulocyte percent 51.0  37 - 80 %G   RBC 4.62  4.04 - 5.48 M/uL   Hemoglobin 12.5  12.2 - 16.2 g/dL   HCT, POC 65.7  84.6 - 47.9 %   MCV 88.5  80 - 97 fL   MCH, POC 27.1  27 - 31.2 pg   MCHC 30.6 (*) 31.8 - 35.4 g/dL   RDW, POC 96.2     Platelet Count, POC 355  142 - 424 K/uL   MPV 8.3  0 - 99.8 fL  POCT SEDIMENTATION RATE      Component Value Range   POCT  SED RATE 50 (*) 0 - 22 mm/hr  POCT GLYCOSYLATED HEMOGLOBIN (HGB A1C)      Component Value Range   Hemoglobin A1C 5.9    POCT SKIN KOH      Component Value Range   Skin KOH, POC Negative      Assessment and Plan: Abbe Amsterdam, MD

## 2012-06-04 ENCOUNTER — Telehealth: Payer: Self-pay | Admitting: Family Medicine

## 2012-06-04 NOTE — Telephone Encounter (Signed)
Message copied by Pearline Cables on Tue Jun 04, 2012  3:21 PM ------      Message from: Pearline Cables      Created: Sun Jun 02, 2012  3:18 PM       Go over repeat labs

## 2012-06-04 NOTE — Telephone Encounter (Signed)
Called and LMOM- her ESR is still about the same.  We may need to look further- ?consider lupus.   Labs to consider: CRP. Anti phospholipid ab, antis DSDNA, anti Smith ab.  Will be in touch with her to see how she is feeling

## 2012-06-05 ENCOUNTER — Telehealth: Payer: Self-pay | Admitting: Family Medicine

## 2012-06-05 NOTE — Telephone Encounter (Signed)
Called no answer LMOM.

## 2012-06-05 NOTE — Telephone Encounter (Signed)
Message copied by Pearline Cables on Wed Jun 05, 2012  5:54 PM ------      Message from: Abbe Amsterdam C      Created: Sun Jun 02, 2012  3:18 PM       Go over repeat labs

## 2012-06-08 ENCOUNTER — Telehealth: Payer: Self-pay | Admitting: Family Medicine

## 2012-06-08 NOTE — Telephone Encounter (Signed)
Called no answer

## 2012-06-09 ENCOUNTER — Telehealth: Payer: Self-pay | Admitting: Family Medicine

## 2012-06-09 NOTE — Telephone Encounter (Signed)
Called and LMOM- I will be in the office this week mon, tues and wed.  Please give me a call when you can!

## 2012-06-10 ENCOUNTER — Encounter: Payer: Self-pay | Admitting: Family Medicine

## 2012-06-10 ENCOUNTER — Telehealth: Payer: Self-pay | Admitting: Family Medicine

## 2012-06-10 NOTE — Telephone Encounter (Signed)
Created in error

## 2012-10-15 ENCOUNTER — Other Ambulatory Visit: Payer: Self-pay | Admitting: Gynecology

## 2012-10-15 DIAGNOSIS — Z1231 Encounter for screening mammogram for malignant neoplasm of breast: Secondary | ICD-10-CM

## 2012-10-15 DIAGNOSIS — Z9882 Breast implant status: Secondary | ICD-10-CM

## 2012-11-11 ENCOUNTER — Ambulatory Visit
Admission: RE | Admit: 2012-11-11 | Discharge: 2012-11-11 | Disposition: A | Discharge status: Home / Self Care | Payer: BC Managed Care – PPO | Source: Ambulatory Visit | Attending: Gynecology | Admitting: Gynecology

## 2012-11-11 ENCOUNTER — Other Ambulatory Visit: Payer: Self-pay | Admitting: Gynecology

## 2012-11-11 DIAGNOSIS — Z9882 Breast implant status: Secondary | ICD-10-CM

## 2012-11-11 DIAGNOSIS — N644 Mastodynia: Secondary | ICD-10-CM

## 2012-11-11 DIAGNOSIS — Z1231 Encounter for screening mammogram for malignant neoplasm of breast: Secondary | ICD-10-CM

## 2012-11-26 ENCOUNTER — Ambulatory Visit
Admission: RE | Admit: 2012-11-26 | Discharge: 2012-11-26 | Disposition: A | Discharge status: Home / Self Care | Payer: BC Managed Care – PPO | Source: Ambulatory Visit | Attending: Gynecology | Admitting: Gynecology

## 2012-11-26 DIAGNOSIS — N644 Mastodynia: Secondary | ICD-10-CM

## 2012-12-04 ENCOUNTER — Other Ambulatory Visit: Payer: Self-pay | Admitting: Gynecology

## 2013-11-12 ENCOUNTER — Other Ambulatory Visit: Payer: Self-pay

## 2013-11-12 DIAGNOSIS — Z1231 Encounter for screening mammogram for malignant neoplasm of breast: Secondary | ICD-10-CM

## 2013-11-13 ENCOUNTER — Other Ambulatory Visit (HOSPITAL_COMMUNITY): Payer: Self-pay | Admitting: Internal Medicine

## 2013-11-13 DIAGNOSIS — R911 Solitary pulmonary nodule: Secondary | ICD-10-CM

## 2013-11-26 ENCOUNTER — Ambulatory Visit (HOSPITAL_COMMUNITY)
Admission: RE | Admit: 2013-11-26 | Discharge: 2013-11-26 | Disposition: A | Discharge status: Home / Self Care | Payer: BC Managed Care – PPO | Source: Ambulatory Visit | Attending: Internal Medicine | Admitting: Internal Medicine

## 2013-11-26 DIAGNOSIS — R911 Solitary pulmonary nodule: Secondary | ICD-10-CM | POA: Insufficient documentation

## 2013-11-26 DIAGNOSIS — K7689 Other specified diseases of liver: Secondary | ICD-10-CM | POA: Insufficient documentation

## 2013-11-26 DIAGNOSIS — R948 Abnormal results of function studies of other organs and systems: Secondary | ICD-10-CM | POA: Insufficient documentation

## 2013-11-26 LAB — GLUCOSE, CAPILLARY: Glucose-Capillary: 127 mg/dL — ABNORMAL HIGH (ref 70–99)

## 2013-11-26 MED ORDER — FLUDEOXYGLUCOSE F - 18 (FDG) INJECTION
9.2000 | Freq: Once | INTRAVENOUS | Status: AC | PRN
  Administered 2013-11-26: 9.2 via INTRAVENOUS

## 2013-12-01 ENCOUNTER — Ambulatory Visit: Payer: BC Managed Care – PPO

## 2014-05-21 ENCOUNTER — Institutional Professional Consult (permissible substitution): Payer: BC Managed Care – PPO | Admitting: Internal Medicine

## 2014-05-22 ENCOUNTER — Ambulatory Visit (INDEPENDENT_AMBULATORY_CARE_PROVIDER_SITE_OTHER): Payer: BC Managed Care – PPO | Admitting: Internal Medicine

## 2014-05-22 ENCOUNTER — Encounter: Payer: Self-pay | Admitting: Internal Medicine

## 2014-05-22 ENCOUNTER — Encounter (INDEPENDENT_AMBULATORY_CARE_PROVIDER_SITE_OTHER): Payer: Self-pay

## 2014-05-22 VITALS — BP 110/70 | HR 74 | Temp 98.4°F | Ht 62.0 in | Wt 167.0 lb

## 2014-05-22 DIAGNOSIS — R918 Other nonspecific abnormal finding of lung field: Secondary | ICD-10-CM | POA: Insufficient documentation

## 2014-05-22 NOTE — Patient Instructions (Signed)
Best option is to have your CT repeated at [facility] in 6 months and send us a copy  Pulmonary follow up is as needed

## 2014-05-22 NOTE — Assessment & Plan Note (Addendum)
First detected CT 01/29/13 HP x 4mm x multiple ? RUL, repeat CT 05/04/14 multiple ? RML nodules none over 4 mm plus linear atx x 18mm RLL - PET neg 11/26/13  - pulmonary eval 05/22/2014 rec repeat CT chest at [facility] x 11/04/14 and placed in tickle file for recall given fm hx and smoking hx    Extended discussion with pt re lung ca and role of ct chest in detecting a curable lesion in an asymptomatic individual wth NNT somewhere between 250-500, so up to 499 pts have scans they don't need.  Since her multple known nodules are unchanged in over a   Year, this renders the likelihood very low but not zero, and the new area is not a nodule at all but needs f/u at 6 months  So placed in tickle file for recall 11/04/14

## 2014-05-22 NOTE — Progress Notes (Signed)
   Subjective:    Patient ID: [name], JD, female    DOB: 02-28-1971,  MRN: [id number]  HPI  43 yowf quit smoking April 15 2014 due to concerns re fm hx lung ca prev eval for CP at  [facility] with mpn's 01/29/13 > PET 11/2013 neg > repeat CT [facility] 05/04/14  ? New nodule > [facility]   05/22/2014 1st [facility] office visit/ Conroy Goracke  Chief Complaint  Patient presents with  . Pulmonary Consult    Referred per Dr. [facility] for eval of pulmonary nodules.  Pt states has some am congestion that she relates to allergies.    nasal symptoms chronic, easily clear p am shower    Not limited by breathing from desired activities    No obvious other patterns in day to day or daytime variabilty or assoc chronic cough or cp or chest tightness, subjective wheeze or overt  hb symptoms. No unusual exp hx or h/o childhood pna/ asthma or knowledge of premature birth.  Sleeping ok without nocturnal  or early am exacerbation  of respiratory (other than nasal) c/o's or need for noct saba. Also denies any obvious fluctuation of symptoms with weather or environmental changes or other aggravating or alleviating factors except as outlined above   Current Medications, Allergies, Complete Past Medical History, Past Surgical History, Family History, and Social History were reviewed in [facility] electronic medical record.              Review of Systems  Constitutional: Negative for fever, chills and unexpected weight change.  HENT: Positive for congestion. Negative for dental problem, ear pain, nosebleeds, postnasal drip, rhinorrhea, sinus pressure, sneezing, sore throat, trouble swallowing and voice change.   Eyes: Negative for visual disturbance.  Respiratory: Negative for cough, choking and shortness of breath.   Cardiovascular: Negative for chest pain and leg swelling.  Gastrointestinal: Negative for vomiting, abdominal pain and diarrhea.  Genitourinary:  Negative for difficulty urinating.       Indigestion  Musculoskeletal: Positive for arthralgias.  Skin: Negative for rash.  Neurological: Positive for headaches. Negative for tremors and syncope.  Hematological: Does not bruise/bleed easily.       Objective:   Physical Exam  Wt Readings from Last 3 Encounters:  05/22/14 167 lb (75.751 kg)  05/31/12 173 lb (78.472 kg)  05/19/12 170 lb (77.111 kg)      HEENT: nl dentition, turbinates, and orophanx. Nl external ear canals without cough reflex   NECK :  without JVD/Nodes/TM/ nl carotid upstrokes bilaterally   LUNGS: no acc muscle use, clear to A and P bilaterally without cough on insp or exp maneuvers   CV:  RRR  no s3 or murmur or increase in P2, no edema   ABD:  soft and nontender with nl excursion in the supine position. No bruits or organomegaly, bowel sounds nl  MS:  warm without deformities, calf tenderness, cyanosis or clubbing  SKIN: warm and dry without lesions    NEURO:  alert, approp, no deficits            Assessment & Plan:

## 2014-06-25 ENCOUNTER — Ambulatory Visit (INDEPENDENT_AMBULATORY_CARE_PROVIDER_SITE_OTHER): Payer: BC Managed Care – PPO | Admitting: Physician Assistant

## 2014-06-25 VITALS — BP 120/72 | HR 77 | Temp 97.8°F | Resp 16 | Ht 62.0 in | Wt 171.4 lb

## 2014-06-25 DIAGNOSIS — IMO0001 Reserved for inherently not codable concepts without codable children: Secondary | ICD-10-CM

## 2014-06-25 DIAGNOSIS — M7918 Myalgia, other site: Secondary | ICD-10-CM

## 2014-06-25 DIAGNOSIS — G5701 Lesion of sciatic nerve, right lower limb: Secondary | ICD-10-CM

## 2014-06-25 DIAGNOSIS — G5700 Lesion of sciatic nerve, unspecified lower limb: Secondary | ICD-10-CM

## 2014-06-25 MED ORDER — DICLOFENAC SODIUM 75 MG PO TBEC: 75.0000 mg | DELAYED_RELEASE_TABLET | Freq: Two times a day (BID) | ORAL | Status: DC

## 2014-06-25 NOTE — Progress Notes (Signed)
   Subjective:    Patient ID: [name], female    DOB: 05-26-1971, 43 y.o.   MRN: [id number]  HPI Pt presents to clinic with right butt pain for the last 1.5 weeks.  She feels like it might have been getting better but then the last 2 days it has been the worst she has experienced.  The pain started when she developed hemorrhoids but they are getting better.  She is having no change in her BM and no pain with passing stool. She sits at work and they have been traveling a lot recently.  She has used ice without much relief but no medications.  She has back problems and has had surgery on L5 but this pain is completely different.  The pain slightly radiates down about 3-4 inches from the area with the most pain but it is an aching pain and not a sharp stabbing.  She has no hip or back pain.  The only position that is pain free for her is flat on her back.  She has not had an injury that she can think of to the area.  The skin is really sensitive over the area with tingling sensation on the skin surface.  Review of Systems     Objective:   Physical Exam  Vitals reviewed. Constitutional: She is oriented to person, place, and time. She appears well-developed and well-nourished.  Genitourinary: Rectal exam shows external hemorrhoid (not thrombosed) and tenderness (mild tenderness on the rigth side during the rectal exam).  Musculoskeletal:       Right hip: She exhibits normal range of motion, normal strength and no tenderness.       Lumbar back: She exhibits normal range of motion, no tenderness and no bony tenderness.       Legs: Scar over L5 area of back.  Piriformis stretch causes reproducible pain in the same area and the same intensity as the chief complaint.  Neurological: She is alert and oriented to person, place, and time.  Skin: Skin is warm and dry.  Light touch painful on skin over the ischial tuberosity, no erythema or rash noted.  Psychiatric: She has a normal mood and  affect. Her behavior is normal. Judgment and thought content normal.       Assessment & Plan:  Piriformis syndrome of right side - Plan: diclofenac (VOLTAREN) 75 MG EC tablet  Right buttock pain  Her exam demonstrates that piriformis would be the most likely.  If she is not improving with NSAIDs and stretches we might need to consider a CT scan of the area to see if there is something going one.  Due to the tingling and skin surface pain shingles must be considered but it seems like the pain has gone on longer than expected without rash is that is going to be the diagnosis.  She will let me know if she develops a rash.  Her tenderness if also right over her ischial tuberosity and is could be a bursa that is inflamed.  She will recheck if not better or getting worse.    D/w and seen with Dr Neva Seat.  Benny Lennert PA-C  [facility] 06/27/2014 12:42 PM

## 2014-06-25 NOTE — Patient Instructions (Signed)
Piriformis Syndrome with Rehab Piriformis syndrome is a condition the affects the nervous system in the area of the hip, and is characterized by pain and possibly a loss of feeling in the backside (posterior) thigh that may extend down the entire length of the leg. The symptoms are caused by an increase in pressure on the sciatic nerve by the piriformis muscle, which is on the back of the hip and is responsible for externally rotating the hip. The sciatic nerve and its branches connect to much of the leg. Normally the sciatic nerve runs between the piriformis muscle and other muscles. However, in certain individuals the nerve runs through the muscle, which causes an increase in pressure on the nerve and results in the symptoms of piriformis syndrome. SYMPTOMS   Pain, tingling, numbness, or burning in the back of the thigh that may also extend down the entire leg.  Occasionally, tenderness in the buttock.  Loss of function of the leg.  Pain that worsens when using the piriformis muscle (running, jumping, or stairs).  Pain that increases with prolonged sitting.  Pain that is lessened by lying flat on the back. CAUSES   Piriformis syndrome is the result of an increase in pressure placed on the sciatic nerve. Oftentimes, piriformis syndrome is an overuse injury.  Stress placed on the nerve from a sudden increase in the intensity, frequency, or duration of training.  Compensation of other extremity injuries. RISK INCREASES WITH:  Sports that involve the piriformis muscle (running, walking, or jumping).  You are born with (congenital) a defect in which the sciatic nerve passes through the muscle. PREVENTION  Warm up and stretch properly before activity.  Allow for adequate recovery between workouts.  Maintain physical fitness:  Strength, flexibility, and endurance.  Cardiovascular fitness. PROGNOSIS  If treated properly, the symptoms of piriformis syndrome usually resolve in 2 to 6  weeks. RELATED COMPLICATIONS   Persistent and possibly permanent pain and numbness in the lower extremity.  Weakness of the extremity that may progress to disability and inability to compete. TREATMENT  The most effective treatment for piriformis syndrome is rest from any activities that aggravate the symptoms. Ice and pain medication may help reduce pain and inflammation. The use of strengthening and stretching exercises may help reduce pain with activity. These exercises may be performed at home or with a therapist. A referral to a therapist may be given for further evaluation and treatment, such as ultrasound. Corticosteroid injections may be given to reduce inflammation that is causing pressure to be placed on the sciatic nerve. If nonsurgical (conservative) treatment is unsuccessful, then surgery may be recommended.  MEDICATION   If pain medication is necessary, then nonsteroidal anti-inflammatory medications, such as aspirin and ibuprofen, or other minor pain relievers, such as acetaminophen, are often recommended.  Do not take pain medication for 7 days before surgery.  Prescription pain relievers may be given if deemed necessary by your caregiver. Use only as directed and only as much as you need.  Corticosteroid injections may be given by your caregiver. These injections should be reserved for the most serious cases, because they may only be given a certain number of times. HEAT AND COLD:   Cold treatment (icing) relieves pain and reduces inflammation. Cold treatment should be applied for 10 to 15 minutes every 2 to 3 hours for inflammation and pain and immediately after any activity that aggravates your symptoms. Use ice packs or massage the area with a piece of ice (ice massage).  Heat   treatment may be used prior to performing the stretching and strengthening activities prescribed by your caregiver, physical therapist, or athletic trainer. Use a heat pack or soak the injury in warm  water. SEEK IMMEDIATE MEDICAL CARE IF:  Treatment seems to offer no benefit, or the condition worsens.  Any medications produce adverse side effects. EXERCISES RANGE OF MOTION (ROM) AND STRETCHING EXERCISES - Piriformis Syndrome These exercises may help you when beginning to rehabilitate your injury. Your symptoms may resolve with or without further involvement from your physician, physical therapist, or athletic trainer. While completing these exercises, remember:   Restoring tissue flexibility helps normal motion to return to the joints. This allows healthier, less painful movement and activity.  An effective stretch should be held for at least 30 seconds.  A stretch should never be painful. You should only feel a gentle lengthening or release in the stretched tissue. STRETCH - Hip Rotators  Lie on your back on a firm surface. Grasp your right / left knee with your right / left hand and your ankle with your opposite hand.  Keeping your hips and shoulders firmly planted, gently pull your right / left knee and rotate your lower leg toward your opposite shoulder until you feel a stretch in your buttocks.  Hold this stretch for __________ seconds. Repeat this stretch __________ times. Complete this stretch __________ times per day. STRETCH - Iliotibial Band  On the floor or bed, lie on your side so your right / left leg is on top. Bend your knee and grab your ankle.  Slowly bring your knee back so that your thigh is in line with your trunk. Keep your heel at your buttocks and gently arch your back so your head, shoulders, and hips line up.  Slowly lower your leg so that your knee approaches the floor/bed until you feel a gentle stretch on the outside of your right / left thigh. If you do not feel a stretch and your knee will not fall farther, place the heel of your opposite foot on top of your knee and pull your thigh down farther.  Hold this stretch for __________ seconds. Repeat  __________ times. Complete __________ times per day. STRENGTHENING EXERCISES - Piriformis Syndrome  These are some of the caregiver again or until your symptoms are resolved. Remember:   Strong muscles with good endurance tolerate stress better.  Do the exercises as initially prescribed by your caregiver. Progress slowly with each exercise, gradually increasing the number of repetitions and weight used under their guidance. STRENGTH - Hip Abductors, Straight Leg Raises Be aware of your form throughout the entire exercise so that you exercise the correct muscles. Sloppy form means that you are not strengthening the correct muscles.  Lie on your side so that your head, shoulders, knee, and hip line up. You may bend your lower knee to help maintain your balance. Your right / left leg should be on top.  Roll your hips slightly forward, so that your hips are stacked directly over each other and your right / left knee is facing forward.  Lift your top leg up 4-6 inches, leading with your heel. Be sure that your foot does not drift forward or that your knee does not roll toward the ceiling.  Hold this position for __________ seconds. You should feel the muscles in your outer hip lifting (you may not notice this until your leg begins to tire).  Slowly lower your leg to the starting position. Allow the muscles to fully   relax before beginning the next repetition. Repeat __________ times. Complete this exercise __________ times per day.  STRENGTH - Hip Abductors, Quadruped  On a firm, lightly padded surface, position yourself on your hands and knees. Your hands should be directly below your shoulders and your knees should be directly below your hips.  Keeping your right / left knee bent, lift your leg out to the side. Keep your legs level and in line with your shoulders.  Position yourself on your hands and knees.  Hold for __________ seconds.  Keeping your trunk steady and your hips level, slowly  lower your leg to the starting position. Repeat __________ times. Complete this exercise __________ times per day.  STRENGTH - Hip Abductors, Standing  Tie one end of a rubber exercise band/tubing to a secure surface (table, pole) and tie a loop at the other end.  Place the loop around your right / left ankle. Keeping your ankle with the band directly opposite of the secured end, step away until there is tension in the tube/band.  Hold onto a chair as needed for balance.  Keeping your back upright, your shoulders over your hips, and your toes pointing forward, lift your right / left leg out to your side. Be sure to lift your leg with your hip muscles. Do not "throw" your leg or tip your body to lift your leg.  Slowly and with control, return to the starting position. Repeat exercise __________ times. Complete this exercise __________ times per day.  Document Released: 10/02/2005 Document Revised: 02/16/2014 Document Reviewed: 01/14/2009 [employer] Patient Information 2015 [employer]. This information is not intended to replace advice given to you by your health care provider. Make sure you discuss any questions you have with your health care provider.  

## 2014-10-23 ENCOUNTER — Other Ambulatory Visit: Payer: Self-pay | Admitting: Internal Medicine

## 2014-10-23 DIAGNOSIS — R918 Other nonspecific abnormal finding of lung field: Secondary | ICD-10-CM

## 2015-01-15 ENCOUNTER — Encounter: Payer: Self-pay | Admitting: Neurology

## 2015-01-18 ENCOUNTER — Ambulatory Visit (INDEPENDENT_AMBULATORY_CARE_PROVIDER_SITE_OTHER): Payer: BLUE CROSS/BLUE SHIELD | Admitting: Neurology

## 2015-01-18 ENCOUNTER — Encounter: Payer: Self-pay | Admitting: Neurology

## 2015-01-18 ENCOUNTER — Ambulatory Visit: Payer: Self-pay | Admitting: Neurology

## 2015-01-18 VITALS — BP 110/72 | HR 83 | Temp 98.4°F | Resp 16 | Ht 62.0 in | Wt 165.0 lb

## 2015-01-18 DIAGNOSIS — R351 Nocturia: Secondary | ICD-10-CM | POA: Diagnosis not present

## 2015-01-18 DIAGNOSIS — G4719 Other hypersomnia: Secondary | ICD-10-CM

## 2015-01-18 DIAGNOSIS — R0683 Snoring: Secondary | ICD-10-CM | POA: Diagnosis not present

## 2015-01-18 DIAGNOSIS — R51 Headache: Secondary | ICD-10-CM

## 2015-01-18 DIAGNOSIS — R519 Headache, unspecified: Secondary | ICD-10-CM

## 2015-01-18 NOTE — Progress Notes (Signed)
Subjective:    Patient ID: Ellie Lunch, JD is a 44 y.o. female.  HPI     Huston Foley, MD, PhD [facility] [address], [address] [address] [place], [place] [address]  Dear Dr. Link Snuffer,   I saw your patient, Jamee Keach, upon your kind request in my neurologic clinic today for initial consultation of her sleep disorder, in particular, concern for underlying obstructive sleep apnea. The patient is unaccompanied today. As you know, Ms. Culverhouse is a  44 year old right-handed woman with an underlying medical history of obesity, allergies, anxiety, hyperlipidemia, status post lower back surgery with chronic low back pain, on narcotic pain medication through Dr. Venetia Maxon in neurosurgery, and reflux disease, who reports snoring and excessive daytime somnolence. She reports having had a sleep study several years ago which did not show any sleep apnea at the time but she was about 30 pounds lighter. She stopped smoking in October 2015. She does not report any restless leg symptoms but is a restless sleeper and twitches in her sleep. Of note, she is on Effexor XR for anxiety. She works full-time as a Education administrator. She tries to be in bed between 10:30 and 11:30 PM and falls asleep quickly but does not stay asleep very well. She wakes herself up from her own snoring. She occasionally wakes up with a headache. She occasionally has to get up to use the bathroom. Her Epworth sleepiness score is 14 out of 24 today and her fatigue score is high at 60. She's not aware of a family history of obstructive sleep apnea but both parents snore. Sometimes she has migrainous headaches with pounding pain and photophobia. She takes Norco one pill every 6 hours on average. She has occasional nasal congestion. Her rise time is 7 AM and she does not wake up well rested.   Her Past Medical History Is Significant For: Past Medical History  Diagnosis Date  . Seasonal allergies   . Anxiety   .  Hyperlipidemia   . Elevated C-reactive protein (CRP)   . Prediabetes   . PID (pelvic inflammatory disease)   . Low libido   . Surgical menopause   . Lumbar herniated disc   . Snoring   . Headache     Her Past Surgical History Is Significant For: Past Surgical History  Procedure Laterality Date  . Breast surgery    . Abdominal hysterectomy  2008  . Spine surgery    . Abdominal adhesion surgery    . Bilateral salpingoophorectomy      Her Family History Is Significant For: Family History  Problem Relation Age of Onset  . Lung cancer Mother     smoked  . Allergies Mother   . Cancer Mother   . Diabetes Mother   . Hyperlipidemia Mother   . Lung cancer Maternal Aunt     smoked  . Lung cancer Maternal Uncle     never smoker  . Colon cancer Maternal Uncle   . Hyperlipidemia Father   . Skin cancer Father   . Cancer Maternal Grandmother   . Heart disease Maternal Grandfather   . Cancer Paternal Grandmother   . Cancer Paternal Grandfather   . Heart disease Paternal Grandfather   . Breast cancer Paternal Aunt     Her Social History Is Significant For: History   Social History  . Marital Status: Married    Spouse Name: N/A  . Number of Children: N/A  . Years of Education: N/A  Social History Main Topics  . Smoking status: Former Smoker -- 0.25 packs/day for 10 years    Types: Cigarettes    Quit date: 04/15/2014  . Smokeless tobacco: Never Used  . Alcohol Use: No  . Drug Use: No  . Sexual Activity: Not on file   Other Topics Concern  . None   Social History Narrative   2 caffeine drinks a day     Her Allergies Are:  Allergies  Allergen Reactions  . Codeine Swelling    Hard to swallow   . Succinylcholine     Hard to wake up from surgery   :   Her Current Medications Are:  Outpatient Encounter Prescriptions as of 01/18/2015  Medication Sig  . Cholecalciferol (VITAMIN D-3) 5000 UNITS TABS Take 1 tablet by mouth daily.  . diclofenac (VOLTAREN) 75 MG EC  tablet Take 1 tablet (75 mg total) by mouth 2 (two) times daily.  Marland Kitchen. estradiol (VIVELLE-DOT) 0.075 MG/24HR Place 1 patch onto the skin 2 (two) times a week.  Marland Kitchen. HYDROcodone-acetaminophen (NORCO) 10-325 MG per tablet Take 1 tablet by mouth every 6 (six) hours as needed.  . loratadine (CLARITIN) 10 MG tablet Take 10 mg by mouth daily.  . Naltrexone-Bupropion HCl ER 8-90 MG TB12 Take by mouth.  . omega-3 acid ethyl esters (LOVAZA) 1 G capsule Take 2 g by mouth 2 (two) times daily.  Marland Kitchen. omeprazole (PRILOSEC) 40 MG capsule Take 40 mg by mouth daily.  Marland Kitchen. triamcinolone cream (KENALOG) 0.1 % Apply 1 application topically 2 (two) times daily.  Marland Kitchen. venlafaxine (EFFEXOR) 75 MG tablet Take 75 mg by mouth daily.   . vitamin E 400 UNIT capsule Take 400 Units by mouth daily.  :  Review of Systems:  Out of a complete 14 point review of systems, all are reviewed and negative with the exception of these symptoms as listed below:   Review of Systems  Constitutional: Positive for fatigue.  Respiratory:       Snoring   Endocrine: Positive for polydipsia.  Skin: Positive for rash.  Allergic/Immunologic: Positive for environmental allergies.       Skin Sensitivity   Neurological:       Slepiness, Snoring   Psychiatric/Behavioral:       Anxiety, Decreased energy     Objective:  Neurologic Exam  Physical Exam Physical Examination:   Filed Vitals:   01/18/15 1416  BP: 110/72  Pulse: 83  Temp: 98.4 F (36.9 C)  Resp: 16    General Examination: The patient is a very pleasant 44 y.o. female in no acute distress. She appears well-developed and well-nourished and very well groomed. She is obese.   HEENT: Normocephalic, atraumatic, pupils are equal, round and reactive to light and accommodation. Funduscopic exam is normal with sharp disc margins noted. Extraocular tracking is good without limitation to gaze excursion or nystagmus noted. Normal smooth pursuit is noted. Hearing is grossly intact. Tympanic  membranes are clear bilaterally. Face is symmetric with normal facial animation and normal facial sensation. Speech is clear with no dysarthria noted. There is no hypophonia. There is no lip, neck/head, jaw or voice tremor. Neck is supple with full range of passive and active motion. There are no carotid bruits on auscultation. Oropharynx exam reveals: mild mouth dryness, good dental hygiene and mild airway crowding, due to narrow airway entry. Mallampati is class I. Tongue protrudes centrally and palate elevates symmetrically. Tonsils are absent. Neck size is 14.75 inches. She has a Absent overbite. Nasal  inspection reveals no significant nasal mucosal bogginess or redness and no septal deviation, but she does seem to have a small nasal anatomy.   Chest: Clear to auscultation without wheezing, rhonchi or crackles noted.  Heart: S1+S2+0, regular and normal without murmurs, rubs or gallops noted.   Abdomen: Soft, non-tender and non-distended with normal bowel sounds appreciated on auscultation.  Extremities: There is no pitting edema in the distal lower extremities bilaterally. Pedal pulses are intact.  Skin: Warm and dry without trophic changes noted. There are no varicose veins.  Musculoskeletal: exam reveals no obvious joint deformities, tenderness or joint swelling or erythema.   Neurologically:  Mental status: The patient is awake, alert and oriented in all 4 spheres. Her immediate and remote memory, attention, language skills and fund of knowledge are appropriate. There is no evidence of aphasia, agnosia, apraxia or anomia. Speech is clear with normal prosody and enunciation. Thought process is linear. Mood is normal and affect is normal.  Cranial nerves II - XII are as described above under HEENT exam. In addition: shoulder shrug is normal with equal shoulder height noted. Motor exam: Normal bulk, strength and tone is noted. There is no drift, tremor or rebound. Romberg is negative. Reflexes  are 2+ throughout. Babinski: Toes are flexor bilaterally. Fine motor skills and coordination: intact with normal finger taps, normal hand movements, normal rapid alternating patting, normal foot taps and normal foot agility.  Cerebellar testing: No dysmetria or intention tremor on finger to nose testing. Heel to shin is unremarkable bilaterally. There is no truncal or gait ataxia.  Sensory exam: intact to light touch, pinprick, vibration, temperature sense in the upper and lower extremities.  Gait, station and balance: She stands easily. No veering to one side is noted. No leaning to one side is noted. Posture is age-appropriate and stance is narrow based. Gait shows normal stride length and normal pace. No problems turning are noted. She turns en bloc. Tandem walk is unremarkable. Intact toe and heel stance is noted.                Assessment and Plan:   In summary, Mycah P Tamblyn, JD is a very pleasant 44 y.o.-year old female with an underlying medical history obesity, allergies, anxiety, hyperlipidemia, status post lower back surgery with chronic low back pain, on narcotic pain medication through Dr. Venetia Maxon in neurosurgery, and reflux disease, whose history and physical exam are indeed concerning for obstructive sleep apnea (OSA). I had a long chat with the patient about my findings and the diagnosis of OSA, its prognosis and treatment options. We talked about medical treatments, surgical interventions and non-pharmacological approaches. I explained in particular the risks and ramifications of untreated moderate to severe OSA, especially with respect to developing cardiovascular disease down the Road, including congestive heart failure, difficult to treat hypertension, cardiac arrhythmias, or stroke. Even type 2 diabetes has, in part, been linked to untreated OSA. Symptoms of untreated OSA include daytime sleepiness, memory problems, mood irritability and mood disorder such as depression and anxiety,  lack of energy, as well as recurrent headaches, especially morning headaches. We talked about trying to maintain a healthy lifestyle in general, as well as the importance of weight control. I encouraged the patient to eat healthy, exercise daily and keep well hydrated, to keep a scheduled bedtime and wake time routine, to not skip any meals and eat healthy snacks in between meals. I advised the patient not to drive when feeling sleepy. I recommended the following at  this time: sleep study with potential positive airway pressure titration. (We will score hypopneas at 3% and split the sleep study into diagnostic and treatment portion, if the estimated. 2 hour AHI is >15/h).   I explained the sleep test procedure to the patient and also outlined possible surgical and non-surgical treatment options of OSA, including the use of a custom-made dental device (which would require a referral to a specialist dentist or oral surgeon), upper airway surgical options, such as pillar implants, radiofrequency surgery, tongue base surgery, and UPPP (which would involve a referral to an ENT surgeon). Rarely, jaw surgery such as mandibular advancement may be considered.  I also explained the CPAP treatment option to the patient, who indicated that she would be willing to try CPAP if the need arises. I explained the importance of being compliant with PAP treatment, not only for insurance purposes but primarily to improve Her symptoms, and for the patient's long term health benefit, including to reduce Her cardiovascular risks. I answered all her questions today and the patient was in agreement. I would like to see her back after the sleep study is completed and encouraged her to call with any interim questions, concerns, problems or updates.   Thank you very much for allowing me to participate in the care of this nice patient. If I can be of any further assistance to you please do not hesitate to call me at  [phone].  Sincerely,   Huston Foley, MD, PhD

## 2015-01-18 NOTE — Patient Instructions (Signed)

## 2015-01-28 ENCOUNTER — Ambulatory Visit (INDEPENDENT_AMBULATORY_CARE_PROVIDER_SITE_OTHER): Payer: BLUE CROSS/BLUE SHIELD | Admitting: Neurology

## 2015-01-28 VITALS — BP 126/80 | HR 82

## 2015-01-28 DIAGNOSIS — G4720 Circadian rhythm sleep disorder, unspecified type: Secondary | ICD-10-CM

## 2015-01-28 DIAGNOSIS — R0683 Snoring: Secondary | ICD-10-CM

## 2015-01-28 DIAGNOSIS — G478 Other sleep disorders: Secondary | ICD-10-CM | POA: Diagnosis not present

## 2015-01-28 DIAGNOSIS — G4710 Hypersomnia, unspecified: Secondary | ICD-10-CM | POA: Diagnosis not present

## 2015-01-28 DIAGNOSIS — G4719 Other hypersomnia: Secondary | ICD-10-CM

## 2015-01-29 NOTE — Sleep Study (Signed)
Please see the scanned sleep study interpretation located in the Procedure tab within the Chart Review section. 

## 2015-02-10 ENCOUNTER — Telehealth: Payer: Self-pay | Admitting: Neurology

## 2015-02-10 NOTE — Telephone Encounter (Signed)
Left message to call back for sleep study results.  

## 2015-02-10 NOTE — Telephone Encounter (Signed)
Please call and notify the patient that the recent sleep study did not show any significant obstructive sleep apnea. Please inform patient that I would like to go over the details of the study during a follow up appointment.  I would like for her to fill out a 2 week sleep diary prior to appt.  * PLEASE SEND PATIENT A 2 WEEK SLEEP DIARY TO FILL OUT BEFORE THE NEXT APPOINTMENT, SO I CAN REVIEW AT THE TIME OF THE APPOINTMENT * Pls arrange a followup appointment. Also, route or fax report to PCP.  Once you have spoken to patient, you can close this encounter.   Thanks,  Huston FoleySaima Adolfo Granieri, MD, PhD [facility]

## 2015-02-11 NOTE — Telephone Encounter (Signed)
Pat called/rerturning [name]'s call. She stated that you could leave a message for report. C/b [phone]

## 2015-02-12 ENCOUNTER — Encounter: Payer: Self-pay | Admitting: Internal Medicine

## 2015-02-12 NOTE — Telephone Encounter (Signed)
I left message stating below results. I explained that I will mail the sleep diary to her and must be completed before we see her back. I also asked for patient to call back to make f/u appt. Sleep study results have been faxed to Dr. Link SnufferHolwerda.

## 2016-07-20 ENCOUNTER — Other Ambulatory Visit: Payer: Self-pay | Admitting: Internal Medicine

## 2016-07-20 DIAGNOSIS — R918 Other nonspecific abnormal finding of lung field: Secondary | ICD-10-CM

## 2016-07-25 ENCOUNTER — Ambulatory Visit
Admission: RE | Admit: 2016-07-25 | Discharge: 2016-07-25 | Disposition: A | Discharge status: Home / Self Care | Payer: BLUE CROSS/BLUE SHIELD | Source: Ambulatory Visit | Attending: Internal Medicine | Admitting: Internal Medicine

## 2016-07-25 DIAGNOSIS — R918 Other nonspecific abnormal finding of lung field: Secondary | ICD-10-CM

## 2016-11-14 ENCOUNTER — Ambulatory Visit (INDEPENDENT_AMBULATORY_CARE_PROVIDER_SITE_OTHER)
Admission: RE | Admit: 2016-11-14 | Discharge: 2016-11-14 | Disposition: A | Discharge status: Home / Self Care | Payer: BLUE CROSS/BLUE SHIELD | Source: Ambulatory Visit | Attending: Internal Medicine | Admitting: Internal Medicine

## 2016-11-14 ENCOUNTER — Ambulatory Visit (INDEPENDENT_AMBULATORY_CARE_PROVIDER_SITE_OTHER): Payer: BLUE CROSS/BLUE SHIELD | Admitting: Internal Medicine

## 2016-11-14 ENCOUNTER — Encounter: Payer: Self-pay | Admitting: Internal Medicine

## 2016-11-14 VITALS — BP 112/64 | HR 86 | Ht 61.5 in | Wt 137.0 lb

## 2016-11-14 DIAGNOSIS — R059 Cough, unspecified: Secondary | ICD-10-CM

## 2016-11-14 DIAGNOSIS — R05 Cough: Secondary | ICD-10-CM | POA: Diagnosis not present

## 2016-11-14 DIAGNOSIS — R918 Other nonspecific abnormal finding of lung field: Secondary | ICD-10-CM

## 2016-11-14 MED ORDER — AZITHROMYCIN 250 MG PO TABS: ORAL_TABLET | ORAL | 0 refills | Status: DC

## 2016-11-14 NOTE — Progress Notes (Signed)
Subjective:    Patient ID: [name], JD, female    DOB: 20-Jul-1971     MRN: [id number]       Brief patient profile:  46 yowf quit smoking April 15 2014 due to concerns re fm hx lung ca prev eval for CP at  [facility] with mpn's 01/29/13 > PET 11/2013 neg > repeat CT [facility] 05/04/14  ? New nodule > refer 05/22/2014 to pulmonary [facility]   History of Present Illness  05/22/2014 1st [facility] office visit/ Darika Ildefonso  Chief Complaint  Patient presents with  . Pulmonary Consult    Referred per Dr. Link [facility]uffer[facility]werda for eval of pulmonary nodules.  Pt states has some am congestion that she relates to allergies.    nasal symptoms chronic, easily clear p am shower   rec Serial ct Per Fleischner Society guidelines      11/14/2016  f/u ov/Shterna Laramee re: mpns/ no pulmonary rx/ maint on zantac 75 mg daily for gerd Chief Complaint  Patient presents with  . Pulmonary Consult    Self referral. Pt c/o non prod cough for the past 2 wks. She states she has lost some wt unintentionally.   some sweats/ chills/  Feeling worse since onset of "head cold" with slt discolored mucus in the morning associated with mild nasal discoloration of  secretions also  No obvious other patterns in day to day or daytime variability or assoc  mucus plugs or hemoptysis or cp or chest tightness, subjective wheeze or overt sinus or hb symptoms. No unusual exp hx or h/o childhood pna/ asthma or knowledge of premature birth.  Sleeping ok without nocturnal  or early am exacerbation  of respiratory  c/o's or need for noct saba. Also denies any obvious fluctuation of symptoms with weather or environmental changes or other aggravating or alleviating factors except as outlined above   Current Medications, Allergies, Complete Past Medical History, Past Surgical History, Family History, and Social History were reviewed in [facility] electronic medical record.  ROS  The following are not active complaints unless bolded sore  throat, dysphagia, dental problems, itching, sneezing,  nasal congestion or excess/ purulent secretions, ear ache,   fever, chills, sweats, unintended wt loss, classically pleuritic or exertional cp,  orthopnea pnd or leg swelling, presyncope, palpitations, abdominal pain, anorexia, nausea, vomiting, diarrhea  or change in bowel or bladder habits, change in stools or urine, dysuria,hematuria,  rash, arthralgias, visual complaints, headache, numbness, weakness or ataxia or problems with walking or coordination,  change in mood/affect or memory.                       Objective:   Physical Exam   11/14/2016       137   05/22/14 167 lb (75.751 kg)  05/31/12 173 lb (78.472 kg)  05/19/12 170 lb (77.111 kg)     amb wf nasal tone to voice - Note on arrival 02 sats  98% on RA     HEENT: nl dentition,  and oropharynx. Nl external ear canals without cough reflex- moderate bilateral non-specific turbinate edema with min mp secretions     NECK :  without JVD/Nodes/TM/ nl carotid upstrokes bilaterally   LUNGS: no acc muscle use, clear to A and P bilaterally without cough on insp or exp maneuvers   CV:  RRR  no s3 or murmur or increase in P2, no edema   ABD:  soft and nontender with nl excursion in the  supine position. No bruits or organomegaly, bowel sounds nl  MS:  warm without deformities, calf tenderness, cyanosis or clubbing  SKIN: warm and dry without lesions    NEURO:  alert, approp, no deficits       I personally reviewed images and agree with radiology impression as follows:  CT w/o contrast  Chest  07/25/16 The largest nodule at the right lung base is stable since prior study. Given the stability for greater than 2 years of, this is compatible with a benign process, likely scarring. Scattered subpleural nodules are also stable compatible with benign nodules. There is a slightly enlarging right middle lobe nodule on image 52 measuring 4 mm. This could be followed with  repeat CT in 12 months.             CXR PA and Lateral:   11/14/2016 :    I personally reviewed images and agree with radiology impression as follows:    There is no evidence of pneumonia nor other acute cardiopulmonary abnormality.          Assessment & Plan:

## 2016-11-14 NOTE — Patient Instructions (Addendum)
Change zantac when having respiratory symptoms  :  Try prilosec otc 20mg   Take 30-60 min before first meal of the day and zantac 150 mg mg one @  bedtime until cough is completely gone for at least a week without the need for cough suppression  zpak and call if not feeling better by Feb 2 pm  ? Needs ct sinus  Please remember to go to the   x-ray department downstairs in the basement  for your tests - we will call you with the results when they are available.  You will need a repeat CT chest in 07/2017 but I will let [facility]  - call me if needed      .

## 2016-11-15 ENCOUNTER — Encounter: Payer: Self-pay | Admitting: Internal Medicine

## 2016-11-15 NOTE — Assessment & Plan Note (Addendum)
Present exacerbations consistent with a URI. However, she also apparently has underlying reflux.  I recommended a Z-Pak and since she is actively coughing aggressive treatment to suppress stomach acid as long as the cough is present  based on: Of the three most common causes of chronic cough, only one (GERD)  can actually cause the other two (asthma and post nasal drip syndrome)  and perpetuate the cylce of cough inducing airway trauma, inflammation, heightened sensitivity to reflux which is prompted by the cough itself via a cyclical mechanism.    This may partially respond to steroids and look like asthma and post nasal drainage but never erradicated completely unless the cough and the secondary reflux are eliminated, preferably both at the same time.  While not intuitively obvious, many patients with chronic low grade reflux do not cough until there is a secondary insult that disturbs the protective epithelial barrier and exposes sensitive nerve endings.  This can be viral as is likely the case here or direct physical injury such as with an endotracheal tube.   The point is that once this occurs, it is difficult to eliminate using anything but a maximally effective acid suppression regimen at least in the short run, accompanied by an appropriate diet to address non acid GERD.   If cough does not respond to the above within 3 weeks of the onset then would be considered subacute and  additional workup should be considered and might include a sinus CT scan since many of her symptoms are nasal in origin.  see avs for instructions unique to this ov

## 2016-11-15 NOTE — Progress Notes (Signed)
Spoke with pt and notified of results per Dr. Wert. Pt verbalized understanding and denied any questions. 

## 2016-11-15 NOTE — Assessment & Plan Note (Signed)
First detected CT 01/29/13 x 4mm x multiple ? RUl, repeat CT 05/04/14 multiple ? RML nodules none over 4 mm plus linear atx x 18mm RLL - PET neg 11/26/13  - pulmonary eval 05/22/2014 rec repeat CT [facility] done 12/02/14 and RLL better, RML c/w scarring/atx not a nodule > see CT chest 07/25/16 rec 12 m f/u   I had an extended discussion with the patient reviewing all relevant studies completed to date and  lasting 15 to 20 minutes of a 25 minute visit on the following ongoing concerns:   CT results reviewed with pt >>> Too small for PET or bx, not suspicious enough for excisional bx > really only option for now is follow the Fleischner society guidelines as rec by radiology = follow-up CT scan in October 2018 would be appropriate although not critical in this low risk situation and I'll defer to Dr. Link [facility]uffer[facility]werda  who scheduled  the previous scan to do the follow-up and pulmonary follow-up can therefore be prn

## 2017-03-05 ENCOUNTER — Ambulatory Visit (INDEPENDENT_AMBULATORY_CARE_PROVIDER_SITE_OTHER): Payer: BLUE CROSS/BLUE SHIELD | Admitting: Family Medicine

## 2017-03-05 ENCOUNTER — Encounter: Payer: Self-pay | Admitting: Family Medicine

## 2017-03-05 VITALS — BP 114/64 | HR 78 | Temp 98.0°F | Resp 18 | Ht 61.22 in | Wt 135.8 lb

## 2017-03-05 DIAGNOSIS — M7062 Trochanteric bursitis, left hip: Secondary | ICD-10-CM

## 2017-03-05 DIAGNOSIS — M7632 Iliotibial band syndrome, left leg: Secondary | ICD-10-CM | POA: Diagnosis not present

## 2017-03-05 MED ORDER — METHYLPREDNISOLONE ACETATE 80 MG/ML IJ SUSP
40.0000 mg | Freq: Once | INTRAMUSCULAR | Status: AC
  Administered 2017-03-05: 40 mg via INTRA_ARTICULAR

## 2017-03-05 NOTE — Progress Notes (Signed)
Subjective:    Patient ID: [name], JD, female    DOB: 1971-08-04, 46 y.o.   MRN: [id number] Chief Complaint  Patient presents with  . Hip Pain    L hip x3weeks     HPI  About 3 weeks ago she gradually developed left lateral hip which has progressively worsened and become constant. No known injury at onset or since. No prior history of similar.  Takes prn hydrocodone for lumbago. Does have a long-standing history of chronic lumbar pain with occasional sciatica status post discectomy so she doesn't know if that is flared up and just feels very different than normal or there is something new going on. Tried asa, ibuprofen, lidocaine patch, heat, ice, and combos of these. Even the hydrocodone isn't really touching this. Ice seems to help the most but is very temporary.  Pain radiates down lateral thigh to right above-knee but does not go below the knee.  No rash or skin changes. No intrinsic weakness though limitations due to pain. 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 Hurts more at night. Hurts to go downstairs.   She is normally a side sleeper on either side but now can't lay on her left side at all.   Past Medical History:  Diagnosis Date  . Anxiety   . Elevated C-reactive protein (CRP)   . Headache   . Hyperlipidemia   . Low libido   . Lumbar herniated disc   . PID (pelvic inflammatory disease)   . Prediabetes   . Seasonal allergies   . Snoring   . Surgical menopause    Past Surgical History:  Procedure Laterality Date  . ABDOMINAL ADHESION SURGERY    . ABDOMINAL HYSTERECTOMY  2008  . BILATERAL SALPINGOOPHORECTOMY    . BREAST SURGERY    . SPINE SURGERY     Current Outpatient Prescriptions on File Prior to Visit  Medication Sig Dispense Refill  . Cholecalciferol (VITAMIN D-3) 5000 UNITS TABS Take 1 tablet by mouth daily.    Marland Kitchen. estradiol (VIVELLE-DOT) 0.075 MG/24HR  Place 1 patch onto the skin 2 (two) times a week.    Marland Kitchen. HYDROcodone Bitartrate ER (ZOHYDRO ER) 15 MG CP12 Take 1 capsule by mouth 2 (two) times daily.    Marland Kitchen. HYDROcodone-acetaminophen (NORCO) 10-325 MG per tablet Take 1 tablet by mouth every 6 (six) hours as needed.    . loratadine (CLARITIN) 10 MG tablet Take 10 mg by mouth daily.    . Loratadine-Pseudoephedrine (CLARITIN-D 24 HOUR PO) Take 1 tablet by mouth daily as needed.    Marland Kitchen. omega-3 acid ethyl esters (LOVAZA) 1 G capsule Take 2 g by mouth 2 (two) times daily.    . ranitidine (ZANTAC) 75 MG tablet Take 75 mg by mouth daily.    Marland Kitchen. venlafaxine (EFFEXOR) 75 MG tablet Take 75 mg by mouth daily.     . vitamin E 400 UNIT capsule Take 400 Units by mouth daily.     No current facility-administered medications on file prior to visit.    Allergies  Allergen Reactions  . Codeine Swelling    Hard to swallow   . Succinylcholine     Hard to wake up from surgery    Family History  Problem Relation Age of Onset  . Lung cancer Mother        smoked  . Allergies Mother   . Cancer Mother   .  Diabetes Mother   . Hyperlipidemia Mother   . Hyperlipidemia Father   . Skin cancer Father   . Cancer Maternal Grandmother   . Heart disease Maternal Grandfather   . Cancer Paternal Grandmother   . Cancer Paternal Grandfather   . Heart disease Paternal Grandfather   . Lung cancer Maternal Aunt        smoked  . Lung cancer Maternal Uncle        never smoker  . Colon cancer Maternal Uncle   . Breast cancer Paternal Aunt    Social History   Social History  . Marital status: Married    Spouse name: N/A  . Number of children: N/A  . Years of education: N/A   Social History Main Topics  . Smoking status: Former Smoker    Packs/day: 0.25    Years: 10.00    Types: Cigarettes    Quit date: 04/15/2014  . Smokeless tobacco: Never Used  . Alcohol use No  . Drug use: No  . Sexual activity: Not Asked   Other Topics Concern  . None   Social History  Narrative   2 caffeine drinks a day    Depression screen [facility] 2/9 03/05/2017  Decreased Interest 0  Down, Depressed, Hopeless 0  PHQ - 2 Score 0     Review of Systems  Constitutional: Positive for activity change. Negative for chills and fever.  Cardiovascular: Negative for leg swelling.  Gastrointestinal: Negative for constipation and diarrhea.  Genitourinary: Negative for difficulty urinating, enuresis, frequency and urgency.  Musculoskeletal: Positive for arthralgias, back pain, gait problem and myalgias. Negative for joint swelling.  Skin: Negative for color change and rash.  Neurological: Negative for weakness and numbness.  Hematological: Negative for adenopathy. Does not bruise/bleed easily.  Psychiatric/Behavioral: Positive for sleep disturbance.       Objective:   Physical Exam  Constitutional: She is oriented to person, place, and time. She appears well-developed and well-nourished.  HENT:  Head: Normocephalic.  Eyes: Conjunctivae are normal. No scleral icterus.  Neck: Normal range of motion. Neck supple.  Cardiovascular: Normal rate, regular rhythm and normal heart sounds.   Pulses:      Dorsalis pedis pulses are 2+ on the right side, and 2+ on the left side.  No lower extremity edema  Pulmonary/Chest: Effort normal and breath sounds normal. No respiratory distress.  Musculoskeletal: Normal range of motion. She exhibits no edema.       Right hip: Normal.       Left hip: She exhibits tenderness and bony tenderness. She exhibits normal range of motion, normal strength, no swelling and no crepitus.       Lumbar back: She exhibits tenderness, pain and spasm. She exhibits normal range of motion, no bony tenderness and no deformity.  Normal hip range of motion bilaterally and significant pain with palpation of left lateral trochanteric icteric bursa over head of greater trochanter. none on right. No tenderness over L spine, paraspinal muscles, or SI joints to palpation  bilaterally. + FADIR + left hip pain with adduction/stretching IT band - IT band proximal to left lateral knee feels more taught than right.  Neurological: She is alert and oriented to person, place, and time. She has normal strength. No sensory deficit. She exhibits normal muscle tone. Coordination and gait normal.  Reflex Scores:      Patellar reflexes are 3+ on the right side and 3+ on the left side.      Achilles reflexes are 2+  on the right side and 2+ on the left side. Negative straight leg raise bilaterally. Strength 5 out of 5 throughout bilateral lower extremities though significant pain with left hip flexor.  Skin: Skin is warm and dry. No erythema.  Well healed vertical scar of approximately 2-3 inches over lower lumbar spine from prior discectomy  Psychiatric: She has a normal mood and affect. Her behavior is normal.     BP 114/64   Pulse 78   Temp 98 F (36.7 C) (Oral)   Resp 18   Ht 5' 1.22" (1.555 m)   Wt 135 lb 12.8 oz (61.6 kg)   SpO2 97%   BMI 25.48 kg/m     Risks/benefits of injection reviewed and verbal informed consent obtained.  Knees and hips positioned at 90 deg flexion with pt positioned in the right lateral decubitus position. Skin cleaned throughly with alcohol followed by betadine x 2.  Anesthesia w/ ethyl chloride cold spray.  Injected trochanteric bursa at area of maximal tenderness by 90 degree lateral approach with 40mg  of DepoMedrol and 5cc of 1% lidocaine using 25g 1 1/2in needle without complications. Pt tolerated procedure well. No EBL.  Assessment & Plan:   1. Trochanteric bursitis of left hip - New diagnosis today. Failed otc nsaids, ice, and decreased activity. Injected bursa with 40mg  of DepoMedrol. Rest/ice x sev d, then gradually resume normal activities. After 1 week may start home exercises given.   2. Iliotibial band syndrome, left leg - refer to PT for further treatment      Orders Placed This Encounter  Procedures  . Ambulatory  referral to Physical Therapy    Referral Priority:   Routine    Referral Type:   Physical Medicine    Referral Reason:   Specialty Services Required    Requested Specialty:   Physical Therapy    Number of Visits Requested:   1    Meds ordered this encounter  Medications  . methylPREDNISolone acetate (DEPO-MEDROL) injection 40 mg      Norberto Sorenson, M.D.  Primary Care at [facility] [address] [place], [place] [address] [phone] phone [phone] fax  03/06/17 4:31 PM

## 2017-03-05 NOTE — Patient Instructions (Addendum)
IF you received an x-ray today, you will receive an invoice from [facility]. Please contact [facility] at [phone] with questions or concerns regarding your invoice.   IF you received labwork today, you will receive an invoice from [facility]. Please contact [facility] at [phone] with questions or concerns regarding your invoice.   Our billing staff will not be able to assist you with questions regarding bills from these companies.  You will be contacted with the lab results as soon as they are available. The fastest way to get your results is to activate your My Chart account. Instructions are located on the last page of this paperwork. If you have not heard from [place] regarding the results in 2 weeks, please contact this office.    Trochanteric Bursitis Trochanteric bursitis is a condition that causes hip pain. Trochanteric bursitis happens when fluid-filled sacs (bursae) in the hip get irritated. Normally these sacs absorb shock and help strong bands of tissue (tendons) in your hip glide smoothly over each other and over your hip bones. What are the causes? This condition results from increased friction between the hip bones and the tendons that go over them. This condition can happen if you:  Have weak hips.  Use your hip muscles too much (overuse).  Get hit in the hip. What increases the risk? This condition is more likely to develop in:  Women.  Adults who are middle-aged or older.  People with arthritis or a spinal condition.  People with weak buttocks muscles (gluteal muscles).  People who have one leg that is shorter than the other.  People who participate in certain kinds of athletic activities, such as:  Running sports, especially long-distance running.  Contact sports, like football or martial arts.  Sports in which falls may occur, like skiing. What are the signs or symptoms? The main symptom of this condition is pain and tenderness  over the point of your hip. The pain may be:  Sharp and intense.  Dull and achy.  Felt on the outside of your thigh. It may increase when you:  Lie on your side.  Walk or run.  Go up on stairs.  Sit.  Stand up after sitting.  Stand for long periods of time. How is this diagnosed? This condition may be diagnosed based on:  Your symptoms.  Your medical history.  A physical exam.  Imaging tests, such as:  X-rays to check your bones.  An MRI or ultrasound to check your tendons and muscles. During your physical exam, your health care provider will check the movement and strength of your hip. He or she may press on the point of your hip to check for pain. How is this treated? This condition may be treated by:  Resting.  Reducing your activity.  Avoiding activities that cause pain.  Using crutches, a cane, or a walker to decrease the strain on your hip.  Taking medicine to help with swelling.  Having medicine injected into the bursae to help with swelling.  Using ice, heat, and massage therapy for pain relief.  Physical therapy exercises for strength and flexibility.  Surgery (rare). Follow these instructions at home: Activity   Rest.  Avoid activities that cause pain.  Return to your normal activities as told by your health care provider. Ask your health care provider what activities are safe for you. Managing pain, stiffness, and swelling   Take over-the-counter and prescription medicines only as told by your health care provider.  If  directed, apply heat to the injured area as told by your health care provider.  Place a towel between your skin and the heat source.  Leave the heat on for 20-30 minutes.  Remove the heat if your skin turns bright red. This is especially important if you are unable to feel pain, heat, or cold. You may have a greater risk of getting burned.  If directed, apply ice to the injured area:  Put ice in a plastic  bag.  Place a towel between your skin and the bag.  Leave the ice on for 20 minutes, 2-3 times a day. General instructions   If the affected leg is one that you use for driving, ask your health care provider when it is safe to drive.  Use crutches, a cane, or a walker as told by your health care provider.  If one of your legs is shorter than the other, get fitted for a shoe insert.  Lose weight if you are overweight. How is this prevented?  Wear supportive footwear that is appropriate for your sport.  If you have hip pain, start any new exercise or sport slowly.  Maintain physical fitness, including:  Strength.  Flexibility. Contact a health care provider if:  Your pain does not improve with 2-4 weeks. Get help right away if:  You develop severe pain.  You have a fever.  You develop increased redness over your hip.  You have a change in your bowel function or bladder function.  You cannot control the muscles in your feet. This information is not intended to replace advice given to you by your health care provider. Make sure you discuss any questions you have with your health care provider. Document Released: 11/09/2004 Document Revised: 06/07/2016 Document Reviewed: 09/17/2015 Elsevier Interactive Patient Education  2017 [employer]   Trochanteric Bursitis Rehab Ask your health care provider which exercises are safe for you. Do exercises exactly as told by your health care provider and adjust them as directed. It is normal to feel mild stretching, pulling, tightness, or discomfort as you do these exercises, but you should stop right away if you feel sudden pain or your pain gets worse.Do not begin these exercises until told by your health care provider. Stretching exercises These exercises warm up your muscles and joints and improve the movement and flexibility of your hip. These exercises also help to relieve pain and stiffness. Exercise A: Iliotibial band stretch    1. Lie on your side with your left / right leg in the top position. 2. Bend your left / right knee and grab your ankle. 3. Slowly bring your knee back so your thigh is behind your body. 4. Slowly lower your knee toward the floor until you feel a gentle stretch on the outside of your left / right thigh. If you do not feel a stretch and your knee will not fall farther, place the heel of your other foot on top of your outer knee and pull your thigh down farther. 5. Hold this position for __________ seconds. 6. Slowly return to the starting position. Repeat __________ times. Complete this exercise __________ times a day. Strengthening exercises These exercises build strength and endurance in your hip and pelvis. Endurance is the ability to use your muscles for a long time, even after they get tired. Exercise B: Bridge (  hip extensors) 1. Lie on your back on a firm surface with your knees bent and your feet flat on the floor. 2. Tighten your  buttocks muscles and lift your buttocks off the floor until your trunk is level with your thighs. You should feel the muscles working in your buttocks and the back of your thighs. If this exercise is too easy, try doing it with your arms crossed over your chest. 3. Hold this position for __________ seconds. 4. Slowly return to the starting position. 5. Let your muscles relax completely between repetitions. Repeat __________ times. Complete this exercise __________ times a day. Exercise C: Squats ( knee extensors and  quadriceps) 1. Stand in front of a table, with your feet and knees pointing straight ahead. You may rest your hands on the table for balance but not for support. 2. Slowly bend your knees and lower your hips like you are going to sit in a chair.  Keep your weight over your heels, not over your toes.  Keep your lower legs upright so they are parallel with the table legs.  Do not let your hips go lower than your knees.  Do not bend lower  than told by your health care provider.  If your hip pain increases, do not bend as low. 3. Hold this position for __________ seconds. 4. Slowly push with your legs to return to standing. Do not use your hands to pull yourself to standing. Repeat __________ times. Complete this exercise __________ times a day. Exercise D: Hip hike 1. Stand sideways on a bottom step. Stand on your left / right leg with your other foot unsupported next to the step. You can hold onto the railing or wall if needed for balance. 2. Keeping your knees straight and your torso square, lift your left / right hip up toward the ceiling. 3. Hold this position for __________ seconds. 4. Slowly let your left / right hip lower toward the floor, past the starting position. Your foot should get closer to the floor. Do not lean or bend your knees. Repeat __________ times. Complete this exercise __________ times a day. Exercise E: Single leg stand 1. Stand near a counter or door frame that you can hold onto for balance as needed. It is helpful to stand in front of a mirror for this exercise so you can watch your hip. 2. Squeeze your left / right buttock muscles then lift up your other foot. Do not let your left / right hip push out to the side. 3. Hold this position for __________ seconds. Repeat __________ times. Complete this exercise __________ times a day. This information is not intended to replace advice given to you by your health care provider. Make sure you discuss any questions you have with your health care provider. Document Released: 11/09/2004 Document Revised: 06/08/2016 Document Reviewed: 09/17/2015 Elsevier Interactive Patient Education  2017 [employer]

## 2017-07-13 ENCOUNTER — Other Ambulatory Visit: Payer: Self-pay | Admitting: Internal Medicine

## 2017-07-13 DIAGNOSIS — R911 Solitary pulmonary nodule: Secondary | ICD-10-CM

## 2017-07-17 ENCOUNTER — Other Ambulatory Visit: Payer: BLUE CROSS/BLUE SHIELD

## 2017-08-02 ENCOUNTER — Ambulatory Visit
Admission: RE | Admit: 2017-08-02 | Discharge: 2017-08-02 | Disposition: A | Discharge status: Home / Self Care | Payer: BC Managed Care – PPO | Source: Ambulatory Visit | Attending: Internal Medicine | Admitting: Internal Medicine

## 2017-08-02 DIAGNOSIS — R911 Solitary pulmonary nodule: Secondary | ICD-10-CM

## 2017-10-07 IMAGING — DX DG CHEST 2V
2 series · 2 of 2 positions shown · non-contrast
Comparison: CT scan of the chest July 25, 2016

CLINICAL DATA: Productive cough for the past month.  Former smoker.

EXAM:
CHEST  2 VIEW

[chest pa]
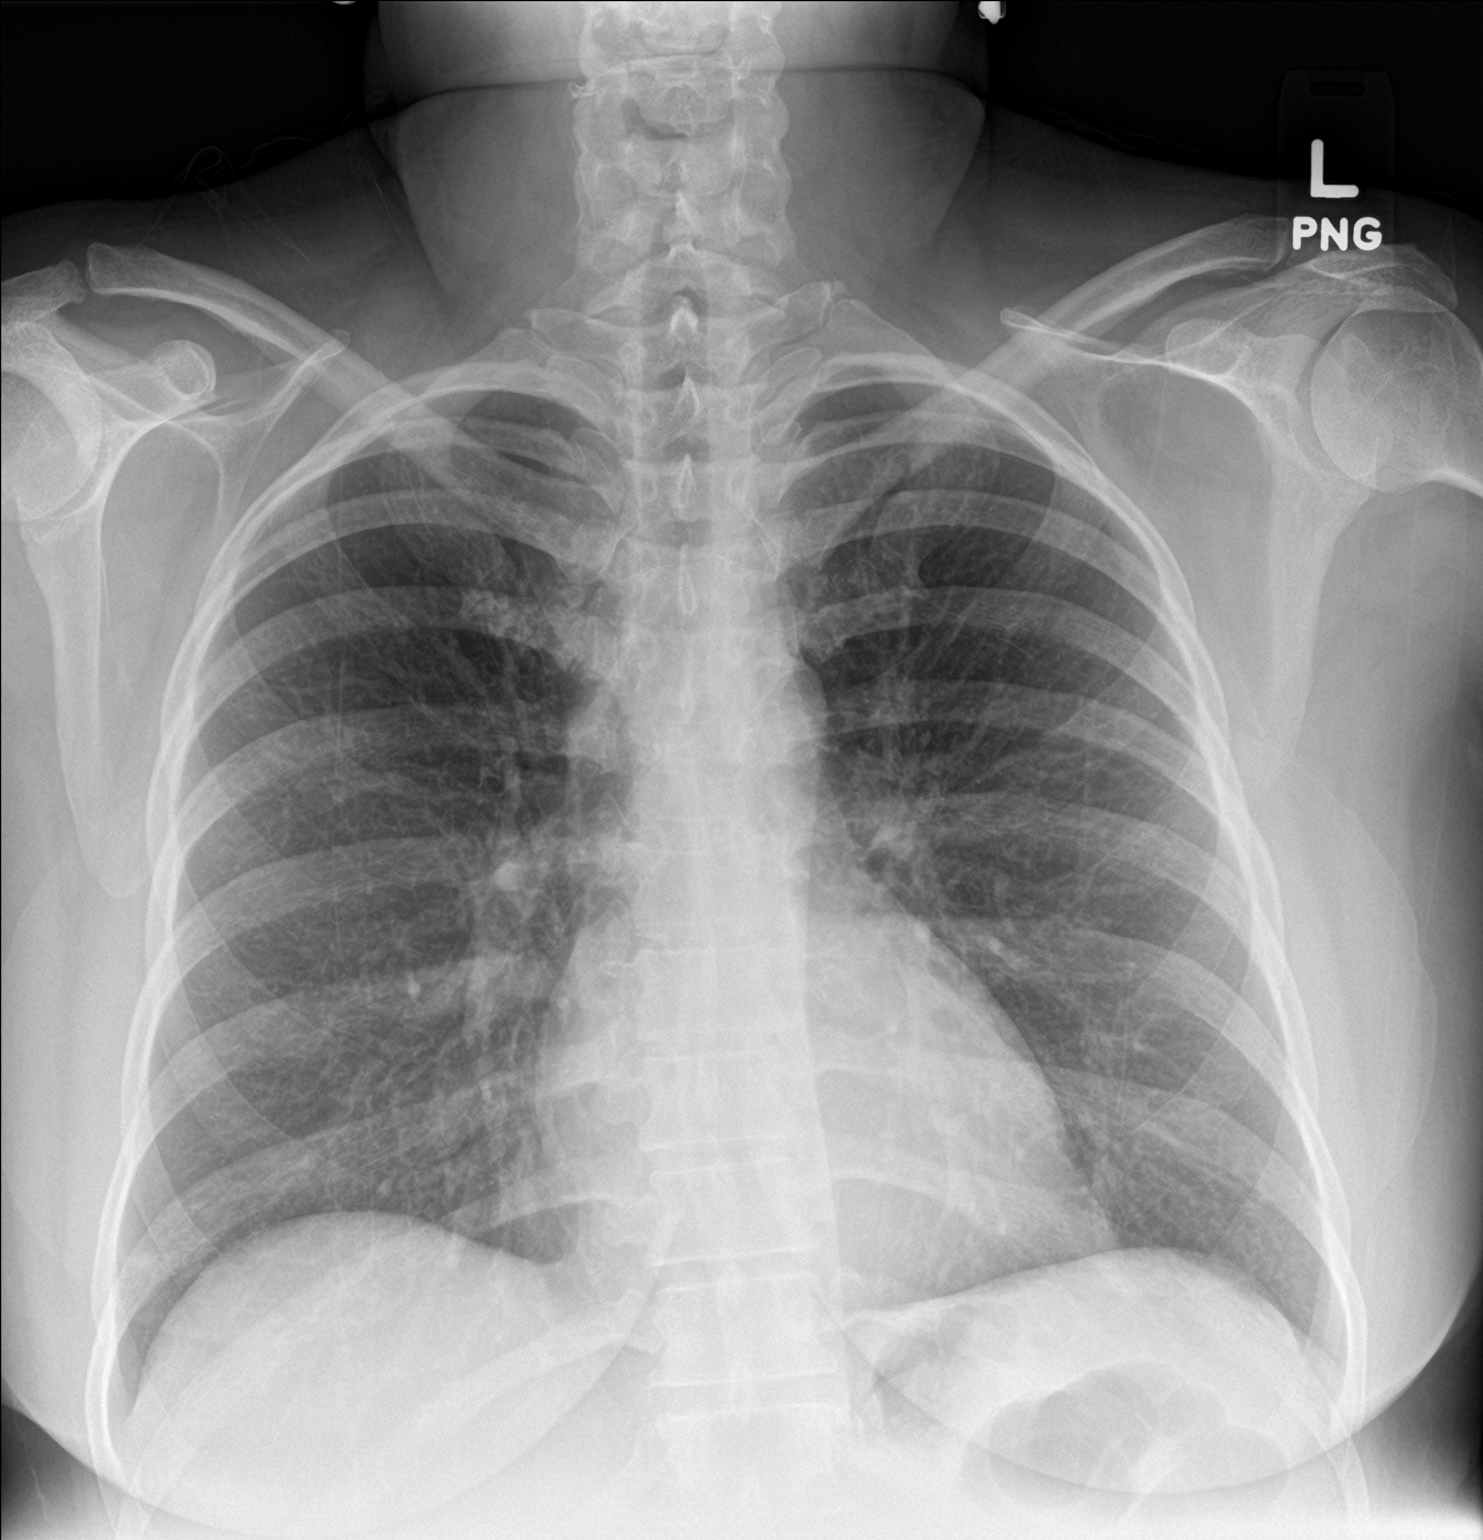

[chest lat]
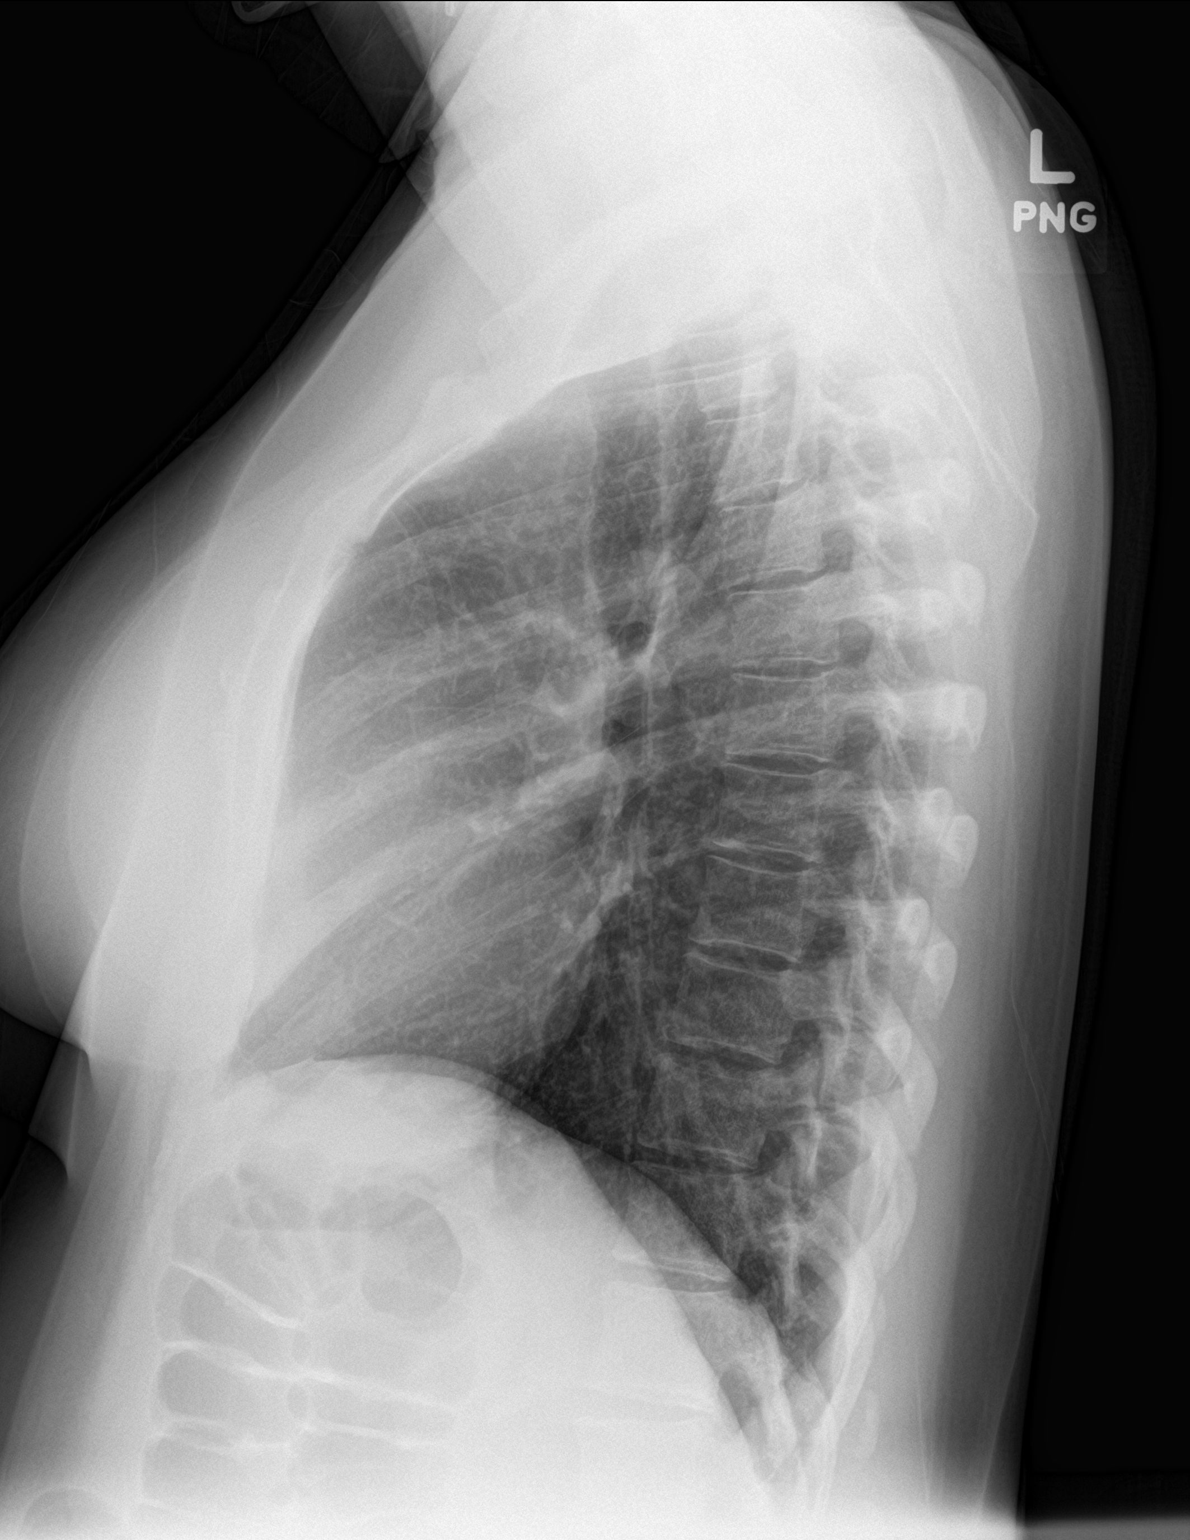

[2 of 2 positions shown; findings below may reference images not displayed]

FINDINGS: The lungs are adequately inflated. There is no focal infiltrate.
There is no pleural effusion. The heart and pulmonary vascularity
are normal. The mediastinum is normal in width. The trachea is
midline. The bony thorax exhibits no acute abnormality.
IMPRESSION: There is no evidence of pneumonia nor other acute cardiopulmonary
abnormality.

## 2017-10-16 HISTORY — PX: BUNIONECTOMY: SHX129

## 2018-05-16 ENCOUNTER — Other Ambulatory Visit: Payer: Self-pay | Admitting: Gynecology

## 2018-05-16 DIAGNOSIS — N644 Mastodynia: Secondary | ICD-10-CM

## 2018-05-24 ENCOUNTER — Ambulatory Visit
Admission: RE | Admit: 2018-05-24 | Discharge: 2018-05-24 | Disposition: A | Discharge status: Home / Self Care | Payer: BC Managed Care – PPO | Source: Ambulatory Visit | Attending: Gynecology | Admitting: Gynecology

## 2018-05-24 DIAGNOSIS — N644 Mastodynia: Secondary | ICD-10-CM

## 2018-05-29 ENCOUNTER — Emergency Department (HOSPITAL_BASED_OUTPATIENT_CLINIC_OR_DEPARTMENT_OTHER)
Admission: EM | Admit: 2018-05-29 | Discharge: 2018-05-29 | Disposition: A | Discharge status: Home / Self Care | Payer: BC Managed Care – PPO | Attending: Emergency Medicine | Admitting: Emergency Medicine

## 2018-05-29 ENCOUNTER — Encounter (HOSPITAL_BASED_OUTPATIENT_CLINIC_OR_DEPARTMENT_OTHER): Payer: Self-pay | Admitting: Emergency Medicine

## 2018-05-29 ENCOUNTER — Emergency Department (HOSPITAL_BASED_OUTPATIENT_CLINIC_OR_DEPARTMENT_OTHER): Payer: BC Managed Care – PPO

## 2018-05-29 ENCOUNTER — Other Ambulatory Visit: Payer: Self-pay

## 2018-05-29 DIAGNOSIS — R05 Cough: Secondary | ICD-10-CM | POA: Insufficient documentation

## 2018-05-29 DIAGNOSIS — Z87891 Personal history of nicotine dependence: Secondary | ICD-10-CM | POA: Insufficient documentation

## 2018-05-29 DIAGNOSIS — R059 Cough, unspecified: Secondary | ICD-10-CM

## 2018-05-29 DIAGNOSIS — Z79899 Other long term (current) drug therapy: Secondary | ICD-10-CM | POA: Insufficient documentation

## 2018-05-29 MED ORDER — IPRATROPIUM-ALBUTEROL 0.5-2.5 (3) MG/3ML IN SOLN: 3.0000 mL | Freq: Four times a day (QID) | RESPIRATORY_TRACT | Status: DC

## 2018-05-29 MED ORDER — ALBUTEROL SULFATE HFA 108 (90 BASE) MCG/ACT IN AERS
1.0000 | INHALATION_SPRAY | Freq: Once | RESPIRATORY_TRACT | Status: AC
  Administered 2018-05-29: 1 via RESPIRATORY_TRACT
  Filled 2018-05-29: qty 6.7

## 2018-05-29 NOTE — Discharge Instructions (Addendum)
Please see the information and instructions below regarding your visit.  Your diagnoses today include:  1. Cough     Tests performed today include: See side panel of your discharge paperwork for testing performed today. Vital signs are listed at the bottom of these instructions.   Your chest x-ray is clear of any cardiac or pulmonary abnormalities.  Your EKG looks normal with no evidence of abnormal heart rhythm or signs of ischemia.  Medications prescribed:    Take any prescribed medications only as prescribed, and any over the counter medications only as directed on the packaging.  Please take your inhaler, 1 to 2 puffs every 6 hours for the next 48 hours.  Home care instructions:  Please follow any educational materials contained in this packet.   Follow-up instructions: Please follow-up with your primary care provider in 5-7 days for further evaluation of your symptoms if they are not completely improved.   Return instructions:  Please return to the Emergency Department if you experience worsening symptoms.  Please return the emergency department if you develop any worsening chest pain, shortness of breath, feeling that you are going to pass out, or dizziness or lightheadedness. Please return if you have any other emergent concerns.  Additional Information:   Your vital signs today were: BP 123/67 (BP Location: Right Arm)    Pulse 88    Temp 98.4 F (36.9 C) (Oral)    Resp 18    Ht 5\' 1"  (1.549 m)    Wt 68 kg    SpO2 95%    BMI 28.34 kg/m  If your blood pressure (BP) was elevated on multiple readings during this visit above 130 for the top number or above 80 for the bottom number, please have this repeated by your primary care provider within one month. --------------  Thank you for allowing us to participate in your care today.

## 2018-05-29 NOTE — ED Provider Notes (Signed)
[facility] EMERGENCY DEPARTMENT Provider Note   CSN: [id number] Arrival date & time: 05/29/18  1428     History   Chief Complaint Chief Complaint  [name] presents with  . Cough    HPI [name], [name] is a 47 y.o. female.  HPI  [name] is a 47 year old female with a history of pulmonary nodules, allergic rhinitis, hyperlipidemia, and anxiety presenting for cough and shortness of breath in the setting of "inhaling water" this morning.  [name] reports that [name] took a shower prior to work, and inhaled water through her mouth.  [name] reports [name] had a coughing fit that lasted a couple minutes, and afterwards [name] felt fatigued and felt like [name] had increased work of breathing.  [name] reports that the coughing persisted into going to work today, and [name] has had persistent fatigue, increased inspiratory effort, and sensation of left-sided chest discomfort underneath the left breast.  [name] reports that [name] was feeling completely normal when [name] woke up this morning, denied any symptoms prior to the onset of this water inhalation, and felt completely at her baseline yesterday.  [name] denies any history of reactive airway disease, cardiovascular disease, or family history of early cardiovascular disease.  [name] denies any history of DVT/PE, cancer treatment, recent immobilization, hospitalization, recent surgery, lower extremity edema or calf tenderness, or hemoptysis.  [name] does take estrogen patch due to hysterectomy with oophorectomy.  Past Medical History:  Diagnosis Date  . Anxiety   . Elevated C-reactive protein (CRP)   . Headache   . Hyperlipidemia   . Low libido   . Lumbar herniated disc   . PID (pelvic inflammatory disease)   . Prediabetes   . Seasonal allergies   . Snoring   . Surgical menopause     [name] Active Problem List   Diagnosis Date Noted  . Cough 11/14/2016  . Multiple pulmonary nodules 05/22/2014    Past Surgical History:    Procedure Laterality Date  . ABDOMINAL ADHESION SURGERY    . ABDOMINAL HYSTERECTOMY  2008  . BILATERAL SALPINGOOPHORECTOMY    . BREAST SURGERY    . SPINE SURGERY       OB History   None      Home Medications    Prior to Admission medications   Medication Sig Start Date End Date Taking? Authorizing Provider  Cholecalciferol (VITAMIN D-3) 5000 UNITS TABS Take 1 tablet by mouth daily.    [provider]  estradiol (VIVELLE-DOT) 0.075 MG/24HR Place 1 patch onto the skin 2 (two) times a week.    [provider]  HYDROcodone Bitartrate ER (ZOHYDRO ER) 15 MG CP12 Take 1 capsule by mouth 2 (two) times daily.    [provider]  HYDROcodone-acetaminophen (NORCO) 10-325 MG per tablet Take 1 tablet by mouth every 6 (six) hours as needed.    [provider]  loratadine (CLARITIN) 10 MG tablet Take 10 mg by mouth daily.    [provider]  Loratadine-Pseudoephedrine (CLARITIN-D 24 HOUR PO) Take 1 tablet by mouth daily as needed.    [provider]  omega-3 acid ethyl esters (LOVAZA) 1 G capsule Take 2 g by mouth 2 (two) times daily.    [provider]  ranitidine (ZANTAC) 75 MG tablet Take 150 mg by mouth daily.    [provider]  venlafaxine (EFFEXOR) 75 MG tablet Take 75 mg by mouth daily.     [provider]  vitamin E 400 UNIT capsule Take 400 Units  by mouth daily.    [provider]    Family History Family History  Problem Relation Age of Onset  . Lung cancer Mother        smoked  . Allergies Mother   . Cancer Mother   . Diabetes Mother   . Hyperlipidemia Mother   . Hyperlipidemia Father   . Skin cancer Father   . Cancer Maternal Grandmother   . Heart disease Maternal Grandfather   . Cancer Paternal Grandmother   . Cancer Paternal Grandfather   . Heart disease Paternal Grandfather   . Lung cancer Maternal Aunt        smoked  . Lung cancer Maternal Uncle        never smoker  . Colon  cancer Maternal Uncle   . Breast cancer Paternal Aunt     Social History Social History   Tobacco Use  . Smoking status: Former Smoker    Packs/day: 0.25    Years: 10.00    Pack years: 2.50    Types: Cigarettes    Last attempt to quit: 04/15/2014    Years since quitting: 4.1  . Smokeless tobacco: Never Used  Substance Use Topics  . Alcohol use: No  . Drug use: No     Allergies   Codeine and Succinylcholine   Review of Systems Review of Systems  Constitutional: Negative for chills and fever.  HENT: Positive for congestion and rhinorrhea.   Respiratory: Positive for cough, chest tightness and shortness of breath. Negative for wheezing.   Cardiovascular: Negative for chest pain and leg swelling.  Gastrointestinal: Negative for abdominal pain, nausea and vomiting.  All other systems reviewed and are negative.    Physical Exam Updated Vital Signs BP 123/67 (BP Location: Right Arm)   Pulse 88   Temp 98.4 F (36.9 C) (Oral)   Resp 18   Ht 5\' 1"  (1.549 m)   Wt 68 kg   SpO2 95%   BMI 28.34 kg/m   Physical Exam  Constitutional: [name] appears well-developed and well-nourished. No distress.  HENT:  Head: Normocephalic and atraumatic.  Mouth/Throat: Oropharynx is clear and moist.  Eyes: Pupils are equal, round, and reactive to light. Conjunctivae and EOM are normal.  Neck: Normal range of motion. Neck supple.  Cardiovascular: Normal rate, regular rhythm, S1 normal and S2 normal.  No murmur heard. Pulses:      Radial pulses are 2+ on the right side, and 2+ on the left side.       Dorsalis pedis pulses are 2+ on the right side, and 2+ on the left side.  No lower extremity edema.  No calf tenderness.  Pulmonary/Chest: Effort normal and breath sounds normal. [name] has no wheezes. [name] has no rales.  Abdominal: Soft. [name] exhibits no distension. There is no tenderness. There is no guarding.  Musculoskeletal: Normal range of motion. [name] exhibits no edema or deformity.    Lymphadenopathy:    [name] has no cervical adenopathy.  Neurological: [name] is alert.  Cranial nerves grossly intact. [name] moves extremities symmetrically and with good coordination.  Skin: Skin is warm and dry. No rash noted. No erythema.  Psychiatric: [name] has a normal mood and affect. Her behavior is normal. Judgment and thought content normal.  Nursing note and vitals reviewed.    ED Treatments / Results  Labs (all labs ordered are listed, but only abnormal results are displayed) Labs Reviewed - No data to display  EKG EKG Interpretation  Date/Time:  Wednesday May 29 2018 15:41:14 EDT Ventricular Rate:  80 PR Interval:    QRS Duration: 82 QT Interval:  360 QTC Calculation: 416 R Axis:   75 Text Interpretation:  Sinus rhythm similar to prior 12/11 Confirmed by Meridee ScoreButler, Michael [phone]([id number]) on 05/29/2018 4:46:31 PM   Radiology Dg Chest 2 View  Result Date: 05/29/2018 CLINICAL DATA:  Cough EXAM: CHEST - 2 VIEW COMPARISON:  CT chest 08/02/2017 FINDINGS: The heart size and mediastinal contours are within normal limits. Both lungs are clear. The visualized skeletal structures are unremarkable. IMPRESSION: No active cardiopulmonary disease. Electronically Signed   By: Elige KoHetal  Patel   On: 05/29/2018 15:03    Procedures Procedures (including critical care time)  Medications Ordered in ED Medications  ipratropium-albuterol (DUONEB) 0.5-2.5 (3) MG/3ML nebulizer solution 3 mL (has no administration in time range)  albuterol (PROVENTIL HFA;VENTOLIN HFA) 108 (90 Base) MCG/ACT inhaler 1 puff (1 puff Inhalation Given 05/29/18 1545)     Initial Impression / Assessment and Plan / ED Course  I have reviewed the triage vital signs and the nursing notes.  Pertinent labs & imaging results that were available during my care of the [name] were reviewed by me and considered in my medical decision making (see chart for details).  Clinical Course as of May 29 1642  Wed May 29, 2018  1642  Reassessed.  [name] reports that her symptoms have improved and are resolved.  No distress at this time.  Heart rate slightly elevated compared to initial triage at 91, however I feel that this is albuterol related.   [AM]    Clinical Course User Index [AM] Elisha PonderMurray, Kataleya Zaugg B, PA-C    [name] is nontoxic-appearing, afebrile, nontachypneic, not tachycardic.  Differential diagnosis includes bronchospasm, ACS, pulmonary embolism, bronchitis, pneumonia.  Doubt ACS, as [name] has no cardiovascular history, [name] having very atypical symptoms, and is able to link her symptom onset to the initiation of coughing.  Doubt pulmonary embolism, as [name] has normal vital signs, does not meet Wells criteria.  Although [name] has estrogen patch use due to history of oophorectomy, pretest probability for pulmonary embolism is low.   Chest x-ray clear cardiopulmonary disease.  EKG normal sinus rhythm without signs of ischemia, infarction, or arrhythmia.  [name] had improvement with albuterol, therefore feel that this is most likely spastic in nature.  [name] was given return precautions for any worsening chest pain shortness breath, syncope or presyncope, or feelings of dizziness or lightheadedness.  Recommend PCP follow-up.  [name] is in understanding and agrees with the plan of care.  This is a supervised visit with Dr. Meridee ScoreMichael Butler. Evaluation, management, and discharge planning discussed with this attending physician.   Final Clinical Impressions(s) / ED Diagnoses   Final diagnoses:  Cough    ED Discharge Orders    None       Delia ChimesMurray, Brittanee Ghazarian B, PA-C 05/29/18 1647    Terrilee FilesButler, Michael C, MD 05/31/18 1345

## 2018-05-29 NOTE — ED Triage Notes (Signed)
Reports inhaled water in the shower this morning and states "I can't stop coughing".  States this has become worse throughout the day.

## 2019-04-09 ENCOUNTER — Other Ambulatory Visit: Payer: Self-pay

## 2019-04-09 ENCOUNTER — Ambulatory Visit
Admission: EM | Admit: 2019-04-09 | Discharge: 2019-04-09 | Disposition: A | Discharge status: Home / Self Care | Payer: BC Managed Care – PPO | Attending: Emergency Medicine | Admitting: Emergency Medicine

## 2019-04-09 DIAGNOSIS — S90862A Insect bite (nonvenomous), left foot, initial encounter: Secondary | ICD-10-CM

## 2019-04-09 DIAGNOSIS — W57XXXA Bitten or stung by nonvenomous insect and other nonvenomous arthropods, initial encounter: Secondary | ICD-10-CM

## 2019-04-09 MED ORDER — TRIAMCINOLONE ACETONIDE 0.1 % EX CREA: 1.0000 "application " | TOPICAL_CREAM | Freq: Two times a day (BID) | CUTANEOUS | 0 refills | Status: DC

## 2019-04-09 NOTE — ED Provider Notes (Signed)
[facility]    CSN: [id number] Arrival date & time: 04/09/19  1817      History   Chief Complaint Chief Complaint  Patient presents with  . Insect Bite    HPI [name], [name] is a 48 y.o. female.   [name], [name] presents with complaints of swelling, itching, pain and redness to her left foot which started yesterday. She was working in her yard and felt a scratching sensation in her shoe which she thought was just a piece of mulch. It persisted however, even causing her to take off her shoe. She later saw two puncture wounds to the affected area, although never saw an actual bug. It has continued to itch, burn and swell. No fevers or chills. Has applied alcohol, "after-bite", vicks which hasn't helped. Denies any previous similar. No known allergies. She does take claritin d daily. No facial or oral involvement. No wheezing or difficulty breathing. Hx of anxiety, headache, hyperlipidemia, lumbar herniated disc.     ROS per HPI, negative if not otherwise mentioned.      Past Medical History:  Diagnosis Date  . Anxiety   . Elevated C-reactive protein (CRP)   . Headache   . Hyperlipidemia   . Low libido   . Lumbar herniated disc   . PID (pelvic inflammatory disease)   . Prediabetes   . Seasonal allergies   . Snoring   . Surgical menopause     Patient Active Problem List   Diagnosis Date Noted  . Cough 11/14/2016  . Multiple pulmonary nodules 05/22/2014    Past Surgical History:  Procedure Laterality Date  . ABDOMINAL ADHESION SURGERY    . ABDOMINAL HYSTERECTOMY  2008  . BILATERAL SALPINGOOPHORECTOMY    . BREAST SURGERY    . SPINE SURGERY      OB History   No obstetric history on file.      Home Medications    Prior to Admission medications   Medication Sig Start Date End Date Taking? Authorizing Provider  Cholecalciferol (VITAMIN D-3) 5000 UNITS TABS Take 1 tablet by mouth daily.    [provider]  estradiol  (VIVELLE-DOT) 0.075 MG/24HR Place 1 patch onto the skin 2 (two) times a week.    [provider]  HYDROcodone Bitartrate ER (ZOHYDRO ER) 15 MG CP12 Take 1 capsule by mouth 2 (two) times daily.    [provider]  HYDROcodone-acetaminophen (NORCO) 10-325 MG per tablet Take 1 tablet by mouth every 6 (six) hours as needed.    [provider]  loratadine (CLARITIN) 10 MG tablet Take 10 mg by mouth daily.    [provider]  Loratadine-Pseudoephedrine (CLARITIN-D 24 HOUR PO) Take 1 tablet by mouth daily as needed.    [provider]  omega-3 acid ethyl esters (LOVAZA) 1 G capsule Take 2 g by mouth 2 (two) times daily.    [provider]  ranitidine (ZANTAC) 75 MG tablet Take 150 mg by mouth daily.    [provider]  triamcinolone cream (KENALOG) 0.1 % Apply 1 application topically 2 (two) times daily. 04/09/19   Zigmund Gottron, NP  venlafaxine (EFFEXOR) 75 MG tablet Take 75 mg by mouth daily.     [provider]  vitamin E 400 UNIT capsule Take 400 Units by mouth daily.    [provider]    Family History Family History  Problem Relation Age of Onset  . Lung cancer Mother  smoked  . Allergies Mother   . Cancer Mother   . Diabetes Mother   . Hyperlipidemia Mother   . Hyperlipidemia Father   . Skin cancer Father   . Cancer Maternal Grandmother   . Heart disease Maternal Grandfather   . Cancer Paternal Grandmother   . Cancer Paternal Grandfather   . Heart disease Paternal Grandfather   . Lung cancer Maternal Aunt        smoked  . Lung cancer Maternal Uncle        never smoker  . Colon cancer Maternal Uncle   . Breast cancer Paternal Aunt     Social History Social History   Tobacco Use  . Smoking status: Former Smoker    Packs/day: 0.25    Years: 10.00    Pack years: 2.50    Types: Cigarettes    Quit date: 04/15/2014    Years since quitting: 4.9  . Smokeless tobacco: Never Used  Substance  Use Topics  . Alcohol use: No  . Drug use: No     Allergies   Codeine and Succinylcholine   Review of Systems Review of Systems   Physical Exam Triage Vital Signs ED Triage Vitals  Enc Vitals Group     BP 04/09/19 1826 102/66     Pulse Rate 04/09/19 1826 84     Resp 04/09/19 1826 16     Temp 04/09/19 1826 98.1 F (36.7 C)     Temp Source 04/09/19 1826 Oral     SpO2 04/09/19 1826 96 %     Weight --      Height --      Head Circumference --      Peak Flow --      Pain Score 04/09/19 1832 4     Pain Loc --      Pain Edu? --      Excl. in GC? --    No data found.  Updated Vital Signs BP 102/66 (BP Location: Left Arm)   Pulse 84   Temp 98.1 F (36.7 C) (Oral)   Resp 16   SpO2 96%   Visual Acuity Right Eye Distance:   Left Eye Distance:   Bilateral Distance:    Right Eye Near:   Left Eye Near:    Bilateral Near:     Physical Exam Constitutional:      General: She is not in acute distress.    Appearance: She is well-developed.  Cardiovascular:     Rate and Rhythm: Normal rate.  Pulmonary:     Effort: Pulmonary effort is normal.  Musculoskeletal:       Feet:     Comments: Redness and swelling to left lateral dorsal aspect of her foot with linear abrasion approximately 0.5cm; strong pedal pulse; cap refill < 2 seconds ; no abscess or fluctuance; skin blanches, no warmth.   Skin:    General: Skin is warm and dry.  Neurological:     Mental Status: She is alert and oriented to person, place, and time.      UC Treatments / Results  Labs (all labs ordered are listed, but only abnormal results are displayed) Labs Reviewed - No data to display  EKG None  Radiology No results found.  Procedures Procedures (including critical care time)  Medications Ordered in UC Medications - No data to display  Initial Impression / Assessment and Plan / UC Course  I have reviewed the triage vital signs and the nursing notes.  Pertinent  labs & imaging  results that were available during my care of the patient were reviewed by me and considered in my medical decision making (see chart for details).     Small abrasion to foot with surrounding redness- patient states it was not originally an abrasion but two puncture/ bite marks, but she had scratched the area which led to opening of the skin. History and physical consistent with likely bug bite and allergic response. A few days of prednisone offered, patient declines and will continue with topical treatments and antihistamines. Return precautions provided. Patient verbalized understanding and agreeable to plan.  Ambulatory out of clinic without difficulty.   Final Clinical Impressions(s) / UC Diagnoses   Final diagnoses:  Insect bite of left foot, initial encounter     Discharge Instructions     Cold compresses.  Continue with daily claritin. May supplement with  benadryl at night.  Topical kenalog cream to help some with swelling and itching.  Ibuprofen as needed.  If symptoms worsen or do not improve in the next week to return to be seen or to follow up with your PCP.     ED Prescriptions    Medication Sig Dispense Auth. Provider   triamcinolone cream (KENALOG) 0.1 % Apply 1 application topically 2 (two) times daily. 30 g Georgetta HaberBurky, Natalie B, NP     Controlled Substance Prescriptions [place] Controlled Substance Registry consulted? Not Applicable   Georgetta HaberBurky, Natalie B, NP 04/09/19 2217

## 2019-04-09 NOTE — Discharge Instructions (Signed)
Cold compresses.  Continue with daily claritin. May supplement with  benadryl at night.  Topical kenalog cream to help some with swelling and itching.  Ibuprofen as needed.  If symptoms worsen or do not improve in the next week to return to be seen or to follow up with your PCP.

## 2019-04-09 NOTE — ED Triage Notes (Signed)
Pt c/o bug bite to the side of lt foot since yesterday, c/o itching and burning

## 2019-09-22 ENCOUNTER — Other Ambulatory Visit: Payer: Self-pay

## 2019-09-22 DIAGNOSIS — Z20822 Contact with and (suspected) exposure to covid-19: Secondary | ICD-10-CM

## 2019-09-23 LAB — NOVEL CORONAVIRUS, NAA: SARS-CoV-2, NAA: NOT DETECTED

## 2019-09-25 ENCOUNTER — Other Ambulatory Visit: Payer: Self-pay

## 2019-09-25 DIAGNOSIS — Z20822 Contact with and (suspected) exposure to covid-19: Secondary | ICD-10-CM

## 2019-09-26 LAB — NOVEL CORONAVIRUS, NAA: SARS-CoV-2, NAA: NOT DETECTED

## 2019-11-18 ENCOUNTER — Encounter: Payer: Self-pay | Admitting: Gastroenterology

## 2019-12-05 ENCOUNTER — Ambulatory Visit: Payer: BC Managed Care – PPO | Attending: Internal Medicine

## 2019-12-05 DIAGNOSIS — Z23 Encounter for immunization: Secondary | ICD-10-CM | POA: Insufficient documentation

## 2019-12-05 NOTE — Progress Notes (Signed)
   Covid-19 Vaccination Clinic  Name:  Marielys, Trinidad    MRN: [id number] DOB: [birth year]  12/05/2019  Ms. Schadler was observed post Covid-19 immunization for 15 minutes without incidence. She was provided with Vaccine Information Sheet and instruction to access the V-Safe system.   Ms. Nishida was instructed to call 911 with any severe reactions post vaccine: Marland Kitchen Difficulty breathing  . Swelling of your face and throat  . A fast heartbeat  . A bad rash all over your body  . Dizziness and weakness    Immunizations Administered    Name Date Dose VIS Date Route   Pfizer COVID-19 Vaccine 12/05/2019  1:53 PM 0.3 mL 09/26/2019 Intramuscular   Manufacturer: [employer], [employer]   Lot: [id number]   NDC: [address]-7

## 2019-12-19 ENCOUNTER — Encounter: Payer: BC Managed Care – PPO | Admitting: Gastroenterology

## 2019-12-23 ENCOUNTER — Other Ambulatory Visit: Payer: Self-pay

## 2019-12-23 ENCOUNTER — Ambulatory Visit: Payer: BC Managed Care – PPO | Admitting: *Deleted

## 2019-12-23 VITALS — Temp 96.4°F | Ht 61.0 in | Wt 147.6 lb

## 2019-12-23 DIAGNOSIS — Z1211 Encounter for screening for malignant neoplasm of colon: Secondary | ICD-10-CM

## 2019-12-23 DIAGNOSIS — Z01818 Encounter for other preprocedural examination: Secondary | ICD-10-CM

## 2019-12-23 NOTE — Progress Notes (Signed)
Pt is aware that care partner will wait in the car during procedure; if they feel like they will be too hot or cold to wait in the car; they may wait in the 4 th floor lobby. Patient is aware to bring only one care partner. We want them to wear a mask (we do not have any that we can provide them), practice social distancing, and we will check their temperatures when they get here.  I did remind the patient that their care partner needs to stay in the parking lot the entire time and have a cell phone available, we will call them when the pt is ready for discharge. Patient will wear mask into building.  covid test 01-05-20 at 3:30 pm   No trouble with anesthesia, difficulty with intubation or hx/fam hx of malignant hyperthermia per pt   No egg or soy allergy  No home oxygen use   No medications for weight loss taken  emmi information given  Spoke with Josh Monday CRNA- about pt's Succylcholine allergy.  Ok for [employer]

## 2019-12-29 HISTORY — PX: COLONOSCOPY: SHX174

## 2019-12-30 ENCOUNTER — Ambulatory Visit: Payer: BC Managed Care – PPO | Attending: Internal Medicine

## 2019-12-30 DIAGNOSIS — Z23 Encounter for immunization: Secondary | ICD-10-CM

## 2019-12-30 NOTE — Progress Notes (Signed)
   Covid-19 Vaccination Clinic  Name:  [name], [name]    MRN: [id number] DOB: 03-22-1971  12/30/2019  [name] was observed post Covid-19 immunization for 15 minutes without incident. She was provided with Vaccine Information Sheet and instruction to access the V-Safe system.   [name] was instructed to call 911 with any severe reactions post vaccine: Marland Kitchen Difficulty breathing  . Swelling of face and throat  . A fast heartbeat  . A bad rash all over body  . Dizziness and weakness   Immunizations Administered    Name Date Dose VIS Date Route   Pfizer COVID-19 Vaccine 12/30/2019  2:56 PM 0.3 mL 09/26/2019 Intramuscular   Manufacturer: [employer], [employer]   Lot: [id number]   NDC: [address]-8

## 2020-01-05 ENCOUNTER — Other Ambulatory Visit: Payer: Self-pay | Admitting: Gastroenterology

## 2020-01-05 ENCOUNTER — Encounter: Payer: Self-pay | Admitting: Gastroenterology

## 2020-01-05 ENCOUNTER — Other Ambulatory Visit: Payer: Self-pay

## 2020-01-05 ENCOUNTER — Ambulatory Visit (INDEPENDENT_AMBULATORY_CARE_PROVIDER_SITE_OTHER): Payer: BC Managed Care – PPO

## 2020-01-05 DIAGNOSIS — Z1159 Encounter for screening for other viral diseases: Secondary | ICD-10-CM

## 2020-01-06 LAB — SARS CORONAVIRUS 2 (TAT 6-24 HRS): SARS Coronavirus 2: NEGATIVE

## 2020-01-08 ENCOUNTER — Encounter: Payer: Self-pay | Admitting: Gastroenterology

## 2020-01-08 ENCOUNTER — Other Ambulatory Visit: Payer: Self-pay

## 2020-01-08 ENCOUNTER — Ambulatory Visit (AMBULATORY_SURGERY_CENTER): Payer: BC Managed Care – PPO | Admitting: Gastroenterology

## 2020-01-08 VITALS — BP 115/72 | HR 67 | Temp 96.2°F | Resp 10 | Ht 61.0 in | Wt 147.0 lb

## 2020-01-08 DIAGNOSIS — Z1211 Encounter for screening for malignant neoplasm of colon: Secondary | ICD-10-CM

## 2020-01-08 MED ORDER — SODIUM CHLORIDE 0.9 % IV SOLN: 500.0000 mL | Freq: Once | INTRAVENOUS | Status: DC

## 2020-01-08 NOTE — Patient Instructions (Signed)
Please, read all of the handouts given to you  By your recovery room nurse.  Thank-you for choosing [place] for your healthcare needs today.  YOU HAD AN ENDOSCOPIC PROCEDURE TODAY AT THE [facility]:   Refer to the procedure report that was given to you for any specific questions about what was found during the examination.  If the procedure report does not answer your questions, please call your gastroenterologist to clarify.  If you requested that your care partner not be given the details of your procedure findings, then the procedure report has been included in a sealed envelope for you to review at your convenience later.  YOU SHOULD EXPECT: Some feelings of bloating in the abdomen. Passage of more gas than usual.  Walking can help get rid of the air that was put into your GI tract during the procedure and reduce the bloating. If you had a lower endoscopy (such as a colonoscopy or flexible sigmoidoscopy) you may notice spotting of blood in your stool or on the toilet paper. If you underwent a bowel prep for your procedure, you may not have a normal bowel movement for a few days.  Please Note:  You might notice some irritation and congestion in your nose or some drainage.  This is from the oxygen used during your procedure.  There is no need for concern and it should clear up in a day or so.  SYMPTOMS TO REPORT IMMEDIATELY:   Following lower endoscopy (colonoscopy or flexible sigmoidoscopy):  Excessive amounts of blood in the stool  Significant tenderness or worsening of abdominal pains  Swelling of the abdomen that is new, acute  Fever of 100F or higher   For urgent or emergent issues, a gastroenterologist can be reached at any hour by calling [phone]. Do not use MyChart messaging for urgent concerns.    DIET:  We do recommend a small meal at first, but then you may proceed to your regular diet.  Drink plenty of fluids but you should avoid alcoholic beverages for 24  hours. Try to increase the fiber in your diet, and drink plenty of water.  Take your benefiber as usual.  ACTIVITY:  You should plan to take it easy for the rest of today and you should NOT DRIVE or use heavy machinery until tomorrow (because of the sedation medicines used during the test).    FOLLOW UP: Our staff will call the number listed on your records 48-72 hours following your procedure to check on you and address any questions or concerns that you may have regarding the information given to you following your procedure. If we do not reach you, we will leave a message.  We will attempt to reach you two times.  During this call, we will ask if you have developed any symptoms of COVID 19. If you develop any symptoms (ie: fever, flu-like symptoms, shortness of breath, cough etc.) before then, please call [phone].  If you test positive for Covid 19 in the 2 weeks post procedure, please call and report this information to [place].    You will need a repeat colonoscopy in 10 years.   SIGNATURES/CONFIDENTIALITY: You and/or your care partner have signed paperwork which will be entered into your electronic medical record.  These signatures attest to the fact that that the information above on your After Visit Summary has been reviewed and is understood.  Full responsibility of the confidentiality of this discharge information lies with you and/or your care-partner.

## 2020-01-08 NOTE — Op Note (Signed)
[place] Patient Name: [name] Procedure Date: 01/08/2020 11:43 AM MRN: [id number] Endoscopist: Justice Britain , MD Age: 49 Referring MD:  Date of Birth: 01/24/1971 Gender: Female Account #: [id number] Procedure:                Colonoscopy Indications:              Screening for colorectal malignant neoplasm Medicines:                Monitored Anesthesia Care Procedure:                Pre-Anesthesia Assessment:                           - Prior to the procedure, a History and Physical                            was performed, and patient medications and                            allergies were reviewed. The patient's tolerance of                            previous anesthesia was also reviewed. The risks                            and benefits of the procedure and the sedation                            options and risks were discussed with the patient.                            All questions were answered, and informed consent                            was obtained. Prior Anticoagulants: The patient has                            taken no previous anticoagulant or antiplatelet                            agents. ASA Grade Assessment: II - A patient with                            mild systemic disease. After reviewing the risks                            and benefits, the patient was deemed in                            satisfactory condition to undergo the procedure.                           After obtaining informed consent, the colonoscope  was passed under direct vision. Throughout the                            procedure, the patient's blood pressure, pulse, and                            oxygen saturations were monitored continuously. The                            Colonoscope was introduced through the anus and                            advanced to the the cecum, identified by the                            appendiceal  orifice, ileocecal valve and palpation.                            The colonoscopy was performed without difficulty.                            The patient tolerated the procedure. The quality of                            the bowel preparation was adequate. The ileocecal                            valve, appendiceal orifice, and rectum were                            photographed. Scope In: 11:55:09 AM Scope Out: 12:09:31 PM Scope Withdrawal Time: 0 hours 10 minutes 56 seconds  Total Procedure Duration: 0 hours 14 minutes 22 seconds  Findings:                 The digital rectal exam findings include                            hemorrhoids. Pertinent negatives include no                            palpable rectal lesions.                           Skin tags were found on perianal exam.                           A moderate amount of semi-liquid stool was found in                            the entire colon, interfering with visualization.                            Lavage of the area was performed using copious  amounts, resulting in clearance with adequate                            visualization.                           The terminal ileum and ileocecal valve appeared                            normal.                           Normal mucosa was found in the entire colon                            otherwise.                           Non-bleeding non-thrombosed external and internal                            hemorrhoids were found during retroflexion, during                            perianal exam and during digital exam. The                            hemorrhoids were Grade II (internal hemorrhoids                            that prolapse but reduce spontaneously). Complications:            No immediate complications. Estimated Blood Loss:     Estimated blood loss was minimal. Estimated blood                            loss: none. Impression:                - Hemorrhoids and skin tags found on digital rectal                            exam.                           - Stool in the entire examined colon Lavaged with                            adequate visualization.                           - The examined portion of the ileum was normal.                           - Normal mucosa in the entire examined colon.                           - Non-bleeding non-thrombosed external and internal  hemorrhoids. Recommendation:           - The patient will be observed post-procedure,                            until all discharge criteria are met.                           - Discharge patient to home.                           - Patient has a contact number available for                            emergencies. The signs and symptoms of potential                            delayed complications were discussed with the                            patient. Return to normal activities tomorrow.                            Written discharge instructions were provided to the                            patient.                           - High fiber diet.                           - Use FiberCon 1 tablet PO daily.                           - Continue present medications.                           - Repeat colonoscopy in 10 years for screening                            purposes.                           - The findings and recommendations were discussed                            with the patient. Justice Britain, MD 01/08/2020 12:17:28 PM

## 2020-01-08 NOTE — Progress Notes (Signed)
PT taken to PACU. Monitors in place. VSS. Report given to RN. 

## 2020-01-12 ENCOUNTER — Telehealth: Payer: Self-pay

## 2020-01-12 NOTE — Telephone Encounter (Signed)
  Follow up Call-  Call back number 01/08/2020  Post procedure Call Back phone  # [phone]  Permission to leave phone message Yes  Some recent data might be hidden     Patient questions:  Do you have a fever, pain , or abdominal swelling? No. Pain Score  0 *  Have you tolerated food without any problems? Yes.    Have you been able to return to your normal activities? Yes.    Do you have any questions about your discharge instructions: Diet   No. Medications  No. Follow up visit  No.  Do you have questions or concerns about your Care? No.  Actions: * If pain score is 4 or above: No action needed, pain <4.  1. Have you developed a fever since your procedure? no  2.   Have you had an respiratory symptoms (SOB or cough) since your procedure? no  3.   Have you tested positive for COVID 19 since your procedure no  4.   Have you had any family members/close contacts diagnosed with the COVID 19 since your procedure?  no   If yes to any of these questions please route to Laverna Peace, RN and Charlett Lango, RN

## 2020-01-19 ENCOUNTER — Telehealth: Payer: Self-pay | Admitting: Gastroenterology

## 2020-01-19 NOTE — Telephone Encounter (Signed)
Received a call from Dr. Ambrose Mantle of [facility]. Patient since the day after her procedure has had increased frequency of urination as well as nocturia.  From Friday onwards she has had some more issues of lower abdominal discomfort as well as dysuria.  She thought she had a UTI. She started taking Azo and has had some improvement in the discomfort. She was seen by Dr. Ambrose Mantle today, he has done a optical urine microscopy with culture pending.  He did not see overt bacteriuria on microscopy. Culture pending.   He is clearing the potential role of further imaging including ultrasound versus CT. I spoke with the patient this afternoon, who relayed this history. Her bowel movements are normal for her at this point in time. Not clear why there is this association with persistent issues of increased urination and frequency. This may be a UTI but we will see what the culture data shows. If the culture data is unremarkable then I do think additional imaging is necessary and I would consider an abdominal ultrasound at a minimum but a CT abdomen/pelvis to rule out bladder retention as well as kidney issues is reasonable. Recent blood work was done by Dr. Ambrose Mantle today to ensure there is no evidence of any elevated white count or other electrolyte abnormalities. Patient agrees with this plan of action but if things worsen or change from tonight and tomorrow she will call and let us know. If that is the case, then she needs a CT abdomen/pelvis with IV and oral contrast. The patient is scheduled to see Dr. Ambrose Mantle in the office on Wednesday for follow-up.  [name], please reach out to Dr. Ebony Hail office tomorrow and please let them know my thoughts in regards to things.  Cheyenne Parish, MD [facility] Office # [phone]

## 2020-01-23 ENCOUNTER — Telehealth: Payer: Self-pay | Admitting: Gastroenterology

## 2020-01-23 DIAGNOSIS — R300 Dysuria: Secondary | ICD-10-CM

## 2020-01-23 DIAGNOSIS — R1030 Lower abdominal pain, unspecified: Secondary | ICD-10-CM

## 2020-01-23 NOTE — Telephone Encounter (Signed)
[name], I was able to talk with the patient this afternoon. Symptoms of dysuria, nocturia, increased frequency are still persisting.  Some mild lower abdominal discomfort as well although she is having normal bowel movements. She reports that the urine culture was negative and her blood work done at gynecology was normal. In this particular setting we need to do additional work-up to ensure there are no complications post colonoscopy from a week and a half ago. Please proceed with scheduling with a CT abdomen/pelvis with IV and oral contrast. Please put an urgent referral to [facility] for evaluation of her symptoms. Thank you. Please let me know if there are any issues. GM

## 2020-01-23 NOTE — Telephone Encounter (Signed)
Tired to reach pt by phone no answer and no voice mail will send My Chart message  We have scheduled a CT scan for you on 01/27/20 at 9 am at [facility] radiology.  You will need to pick up contrast and instructions at [facility] today by 5 if possible.  If no, please pick up as soon as possible on Monday.  We have also made a referral to [facility].  If you have not heard from that office in next week please call and let us know.  Thank you

## 2020-01-23 NOTE — Telephone Encounter (Signed)
Dr Meridee Score the pt called to say that she had her GYN workup and that was negative.  She was told to call back and speak with you regarding further possible treatment.  Do you want he to have a follow up visit?

## 2020-01-27 ENCOUNTER — Ambulatory Visit (HOSPITAL_COMMUNITY)
Admission: RE | Admit: 2020-01-27 | Discharge: 2020-01-27 | Disposition: A | Discharge status: Home / Self Care | Payer: BC Managed Care – PPO | Source: Ambulatory Visit | Attending: Gastroenterology | Admitting: Gastroenterology

## 2020-01-27 ENCOUNTER — Other Ambulatory Visit: Payer: Self-pay

## 2020-01-27 DIAGNOSIS — R300 Dysuria: Secondary | ICD-10-CM | POA: Diagnosis present

## 2020-01-27 DIAGNOSIS — R1030 Lower abdominal pain, unspecified: Secondary | ICD-10-CM | POA: Insufficient documentation

## 2020-01-27 MED ORDER — IOHEXOL 300 MG/ML  SOLN
80.0000 mL | Freq: Once | INTRAMUSCULAR | Status: AC | PRN
  Administered 2020-01-27: 80 mL via INTRAVENOUS

## 2020-01-28 NOTE — Telephone Encounter (Signed)
Patty, Please print out the CT scan result and make a copy for Ms. Huante and have it mailed to her. Also, please print out the PET-CT result from 2/15 and make a copy for Ms. Fallin and have it mailed to her. Please make sure that you send a copy to the office of Dr. Ambrose Mantle of both the CT from this week and the previously noted PET-CT. Thank you. GM

## 2020-05-25 ENCOUNTER — Other Ambulatory Visit: Payer: Self-pay

## 2020-05-25 ENCOUNTER — Other Ambulatory Visit: Payer: BC Managed Care – PPO

## 2020-05-25 DIAGNOSIS — Z20822 Contact with and (suspected) exposure to covid-19: Secondary | ICD-10-CM

## 2020-05-26 LAB — SARS-COV-2, NAA 2 DAY TAT

## 2020-05-26 LAB — NOVEL CORONAVIRUS, NAA: SARS-CoV-2, NAA: NOT DETECTED

## 2020-11-02 ENCOUNTER — Emergency Department (HOSPITAL_BASED_OUTPATIENT_CLINIC_OR_DEPARTMENT_OTHER): Payer: BC Managed Care – PPO

## 2020-11-02 ENCOUNTER — Other Ambulatory Visit: Payer: Self-pay

## 2020-11-02 ENCOUNTER — Emergency Department (HOSPITAL_BASED_OUTPATIENT_CLINIC_OR_DEPARTMENT_OTHER)
Admission: EM | Admit: 2020-11-02 | Discharge: 2020-11-02 | Disposition: A | Discharge status: Home / Self Care | Payer: BC Managed Care – PPO | Attending: Emergency Medicine | Admitting: Emergency Medicine

## 2020-11-02 ENCOUNTER — Encounter (HOSPITAL_BASED_OUTPATIENT_CLINIC_OR_DEPARTMENT_OTHER): Payer: Self-pay | Admitting: *Deleted

## 2020-11-02 DIAGNOSIS — Z87891 Personal history of nicotine dependence: Secondary | ICD-10-CM | POA: Insufficient documentation

## 2020-11-02 DIAGNOSIS — S99912A Unspecified injury of left ankle, initial encounter: Secondary | ICD-10-CM | POA: Insufficient documentation

## 2020-11-02 DIAGNOSIS — S93402A Sprain of unspecified ligament of left ankle, initial encounter: Secondary | ICD-10-CM

## 2020-11-02 DIAGNOSIS — W009XXA Unspecified fall due to ice and snow, initial encounter: Secondary | ICD-10-CM | POA: Diagnosis not present

## 2020-11-02 HISTORY — DX: Overactive bladder: N32.81

## 2020-11-02 MED ORDER — ACETAMINOPHEN 500 MG PO TABS
1000.0000 mg | ORAL_TABLET | Freq: Once | ORAL | Status: AC
  Administered 2020-11-02: 1000 mg via ORAL
  Filled 2020-11-02: qty 2

## 2020-11-02 MED ORDER — DICLOFENAC SODIUM ER 100 MG PO TB24: 100.0000 mg | ORAL_TABLET | Freq: Every day | ORAL | 0 refills | Status: DC

## 2020-11-02 MED ORDER — IBUPROFEN 800 MG PO TABS
800.0000 mg | ORAL_TABLET | Freq: Once | ORAL | Status: AC
  Administered 2020-11-02: 800 mg via ORAL
  Filled 2020-11-02: qty 1

## 2020-11-02 NOTE — ED Provider Notes (Signed)
[facility] EMERGENCY DEPARTMENT Provider Note   CSN: [id number] Arrival date & time: 11/02/20  2018     History Chief Complaint  Patient presents with  . Fall    Left ankle    [name], [name] is a 50 y.o. female.  The history is provided by the patient.  Fall This is a new problem. The current episode started 3 to 5 hours ago. The problem occurs constantly. The problem has not changed since onset.Pertinent negatives include no chest pain, no abdominal pain, no headaches and no shortness of breath. Nothing aggravates the symptoms. Nothing relieves the symptoms. She has tried nothing for the symptoms. The treatment provided no relief.  Slipped on the ice and twisted left ankle and now with pain.      Past Medical History:  Diagnosis Date  . Allergy   . Anxiety   . Elevated C-reactive protein (CRP)   . GERD (gastroesophageal reflux disease)   . Headache   . Hyperlipidemia   . Low libido   . Lumbar herniated disc   . Overactive bladder   . PID (pelvic inflammatory disease)   . Prediabetes   . Seasonal allergies   . Snoring   . Surgical menopause     Patient Active Problem List   Diagnosis Date Noted  . Cough 11/14/2016  . Multiple pulmonary nodules 05/22/2014    Past Surgical History:  Procedure Laterality Date  . ABDOMINAL ADHESION SURGERY    . ABDOMINAL HYSTERECTOMY  2008  . BILATERAL SALPINGOOPHORECTOMY    . BREAST SURGERY     breast augmentation  . BUNIONECTOMY    . SPINE SURGERY    . TONSILLECTOMY       OB History   No obstetric history on file.     Family History  Problem Relation Age of Onset  . Lung cancer Mother        smoked  . Allergies Mother   . Cancer Mother   . Diabetes Mother   . Hyperlipidemia Mother   . Hyperlipidemia Father   . Skin cancer Father   . Cancer Maternal Grandmother   . Heart disease Maternal Grandfather   . Cancer Paternal Grandmother   . Cancer Paternal Grandfather   . Heart disease Paternal  Grandfather   . Lung cancer Maternal Aunt        smoked  . Lung cancer Maternal Uncle        never smoker  . Colon cancer Maternal Uncle   . Breast cancer Paternal Aunt   . Colon cancer Cousin   . Colon cancer Cousin   . Esophageal cancer Neg Hx   . Stomach cancer Neg Hx   . Rectal cancer Neg Hx     Social History   Tobacco Use  . Smoking status: Former Smoker    Packs/day: 0.25    Years: 10.00    Pack years: 2.50    Types: Cigarettes    Quit date: 04/15/2014    Years since quitting: 6.5  . Smokeless tobacco: Never Used  Vaping Use  . Vaping Use: Never used  Substance Use Topics  . Alcohol use: No  . Drug use: No    Home Medications Prior to Admission medications   Medication Sig Start Date End Date Taking? Authorizing Provider  Cholecalciferol (VITAMIN D-3) 5000 UNITS TABS Take 1 tablet by mouth daily.    [provider]  estradiol (VIVELLE-DOT) 0.075 MG/24HR Place 1 patch onto the skin 2 (two) times  a week.    [provider]  HYDROcodone-acetaminophen (NORCO) 10-325 MG per tablet Take 1 tablet by mouth every 6 (six) hours as needed.    [provider]  loratadine (CLARITIN) 10 MG tablet Take 10 mg by mouth daily.    [provider]  Loratadine-Pseudoephedrine (CLARITIN-D 24 HOUR PO) Take 1 tablet by mouth daily as needed.    [provider]  pantoprazole (PROTONIX) 40 MG tablet Take 40 mg by mouth daily.    [provider]  triamcinolone cream (KENALOG) 0.1 % Apply 1 application topically 2 (two) times daily. Patient not taking: Reported on 12/23/2019 04/09/19   Linus Mako B, NP  VASCEPA 1 g capsule Take 2 g by mouth 2 (two) times daily. 12/18/19   [provider]  venlafaxine (EFFEXOR) 75 MG tablet Take 75 mg by mouth daily.     [provider]  vitamin E 400 UNIT capsule Take 400 Units by mouth daily.    [provider]    Allergies    Codeine and Succinylcholine  Review of Systems    Review of Systems  Constitutional: Negative for fever.  HENT: Negative for congestion.   Eyes: Negative for visual disturbance.  Respiratory: Negative for shortness of breath.   Cardiovascular: Negative for chest pain.  Gastrointestinal: Negative for abdominal pain.  Genitourinary: Negative for dyspareunia.  Musculoskeletal: Positive for arthralgias.  Skin: Negative for rash.  Neurological: Negative for headaches.  Psychiatric/Behavioral: Negative for agitation.  All other systems reviewed and are negative.   Physical Exam Updated Vital Signs BP 117/62   Pulse 77   Temp 98.6 F (37 C) (Oral)   Resp 15   Ht 5\' 1"  (1.549 m)   Wt 68 kg   SpO2 98%   BMI 28.34 kg/m   Physical Exam Vitals and nursing note reviewed.  Constitutional:      General: She is not in acute distress.    Appearance: Normal appearance.  HENT:     Head: Normocephalic and atraumatic.     Nose: Nose normal.  Eyes:     Pupils: Pupils are equal, round, and reactive to light.  Cardiovascular:     Rate and Rhythm: Normal rate and regular rhythm.     Pulses: Normal pulses.     Heart sounds: Normal heart sounds.  Pulmonary:     Effort: Pulmonary effort is normal.     Breath sounds: Normal breath sounds.  Abdominal:     General: Abdomen is flat. Bowel sounds are normal.     Palpations: Abdomen is soft.     Tenderness: There is no abdominal tenderness. There is no guarding.  Musculoskeletal:     Cervical back: Normal range of motion and neck supple.     Right ankle: Normal.     Right Achilles Tendon: Normal.     Left ankle: Normal.     Left Achilles Tendon: Normal.     Right foot: Normal.     Left foot: Normal.  Skin:    General: Skin is warm and dry.     Capillary Refill: Capillary refill takes less than 2 seconds.  Neurological:     General: No focal deficit present.     Mental Status: She is alert and oriented to person, place, and time.     Deep Tendon Reflexes: Reflexes normal.   Psychiatric:        Mood and Affect: Mood normal.        Behavior: Behavior normal.  ED Results / Procedures / Treatments   Labs (all labs ordered are listed, but only abnormal results are displayed) Labs Reviewed - No data to display  EKG None  Radiology DG Ankle Complete Left  Result Date: 11/02/2020 CLINICAL DATA:  Fall, left ankle injury from fall on ice EXAM: LEFT ANKLE COMPLETE - 3+ VIEW COMPARISON:  None. FINDINGS: Minimal ankle swelling without discernible ankle effusion. No acute bony abnormality. Specifically, no fracture, subluxation, or dislocation. Posterior calcaneal spur noted. Midfoot and hindfoot alignment is grossly preserved though incompletely assessed on these nondedicated, nonweightbearing films. IMPRESSION: Minimal ankle swelling without acute osseous abnormality. Electronically Signed   By: Kreg Shropshire M.D.   On: 11/02/2020 21:04    Procedures Procedures (including critical care time)  Medications Ordered in ED Medications  ibuprofen (ADVIL) tablet 800 mg (800 mg Oral Given 11/02/20 2307)  acetaminophen (TYLENOL) tablet 1,000 mg (1,000 mg Oral Given 11/02/20 2307)    ED Course  I have reviewed the triage vital signs and the nursing notes.  Pertinent labs & imaging results that were available during my care of the patient were reviewed by me and considered in my medical decision making (see chart for details).    Ice, elevation and NSAIDs.     [name], [name] was evaluated in Emergency Department on 11/02/2020 for the symptoms described in the history of present illness. She was evaluated in the context of the global COVID-19 pandemic, which necessitated consideration that the patient might be at risk for infection with the SARS-CoV-2 virus that causes COVID-19. Institutional protocols and algorithms that pertain to the evaluation of patients at risk for COVID-19 are in a state of rapid change based on information released by regulatory bodies  including the CDC and federal and state organizations. These policies and algorithms were followed during the patient's care in the ED.  Final Clinical Impression(s) / ED Diagnoses Return for intractable cough, coughing up blood,fevers >100.4 unrelieved by medication, shortness of breath, intractable vomiting, chest pain, shortness of breath, weakness,numbness, changes in speech, facial asymmetry,abdominal pain, passing out,Inability to tolerate liquids or food, cough, altered mental status or any concerns. No signs of systemic illness or infection. The patient is nontoxic-appearing on exam and vital signs are within normal limits.   I have reviewed the triage vital signs and the nursing notes. Pertinent labs &imaging results that were available during my care of the patient were reviewed by me and considered in my medical decision making (see chart for details).After history, exam, and medical workup I feel the patient has beenappropriately medically screened and is safe for discharge home. Pertinent diagnoses were discussed with the patient. Patient was given return precautions.   Chequita Mofield, MD 11/02/20 2336

## 2020-11-02 NOTE — ED Triage Notes (Signed)
C/o fall with left ankle injury from fall on ice at 6 pm

## 2020-11-02 NOTE — ED Notes (Signed)
EDP at bedside  

## 2020-11-02 NOTE — ED Notes (Signed)
Patient transported to X-ray 

## 2020-11-02 NOTE — ED Notes (Signed)
ACE Wrap applied per EDP; CMS intact after application

## 2020-11-16 ENCOUNTER — Ambulatory Visit: Payer: BC Managed Care – PPO

## 2020-11-19 ENCOUNTER — Other Ambulatory Visit: Payer: Self-pay

## 2020-11-19 ENCOUNTER — Ambulatory Visit (HOSPITAL_COMMUNITY)
Admission: RE | Admit: 2020-11-19 | Discharge: 2020-11-19 | Disposition: A | Discharge status: Home / Self Care | Payer: BC Managed Care – PPO | Source: Ambulatory Visit | Attending: Orthopaedic Surgery | Admitting: Orthopaedic Surgery

## 2020-11-19 ENCOUNTER — Other Ambulatory Visit (HOSPITAL_COMMUNITY): Payer: Self-pay | Admitting: Orthopaedic Surgery

## 2020-11-19 DIAGNOSIS — M7989 Other specified soft tissue disorders: Secondary | ICD-10-CM

## 2020-11-19 DIAGNOSIS — M79662 Pain in left lower leg: Secondary | ICD-10-CM

## 2020-11-19 NOTE — Progress Notes (Signed)
Lower extremity venous LT study completed.  Preliminary results relayed to Tresa Endo for Jerl Santos, MD.    See CV Proc for preliminary results report.   Jean Rosenthal, RDMS

## 2020-12-19 IMAGING — CT CT ABD-PELV W/ CM
2 of 5 series · 16 of 46 positions shown, 18 images · IV contrast (omnipaque)
Comparison: 11/10/2013

CLINICAL DATA: Abdominal pain, dysuria

EXAM:
CT ABDOMEN AND PELVIS WITH CONTRAST
TECHNIQUE: Multidetector CT imaging of the abdomen and pelvis was performed
using the standard protocol following bolus administration of
intravenous contrast.
CONTRAST:  80mL OMNIPAQUE IOHEXOL 300 MG/ML  SOLN

[Series 3: abd/ pelvis 5.0 i30f 2 · axial · 0.76mm/px · z∈[+582,+1052]mm · 13 of 106 slices shown, 15 images]
[im 6/106  soft-tissue]
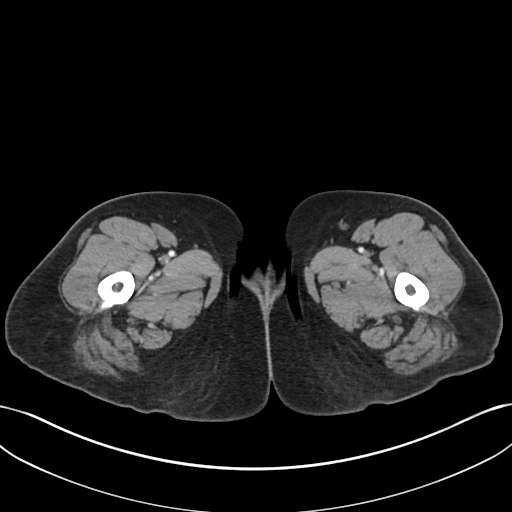
[im 6/106  bone]
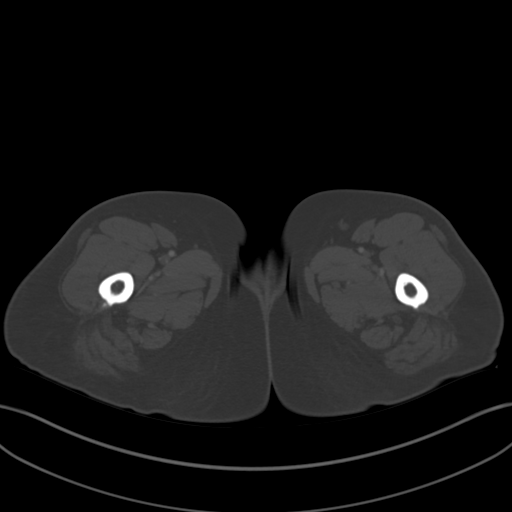
[im 12/106  soft-tissue]
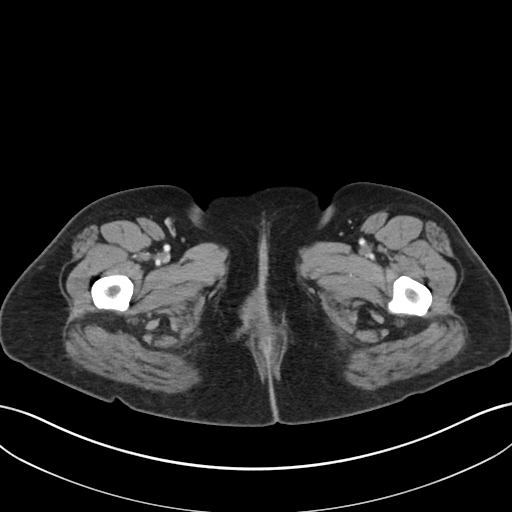
[im 24/106  soft-tissue]
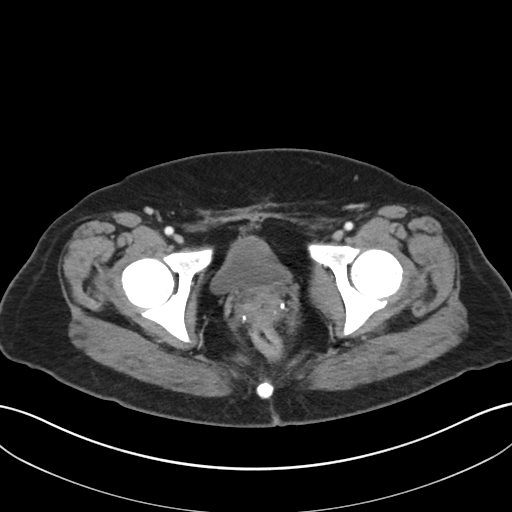
[im 30/106  soft-tissue]
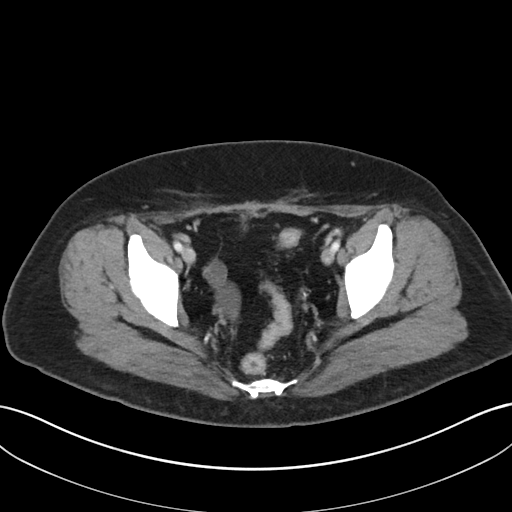
[im 36/106  soft-tissue]
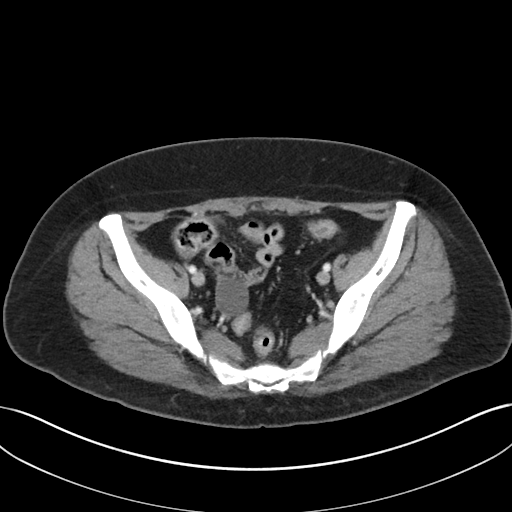
[im 47/106  soft-tissue]
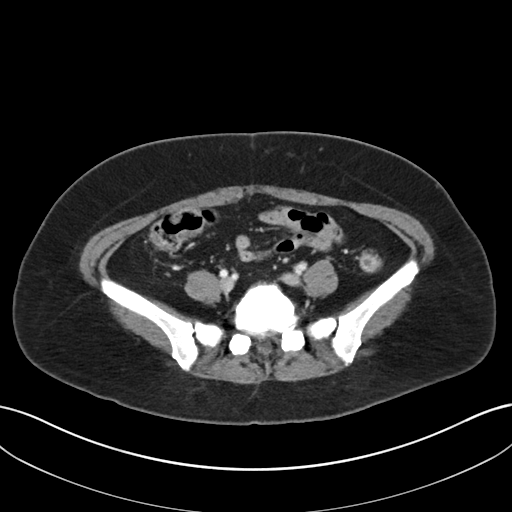
[im 53/106  soft-tissue]
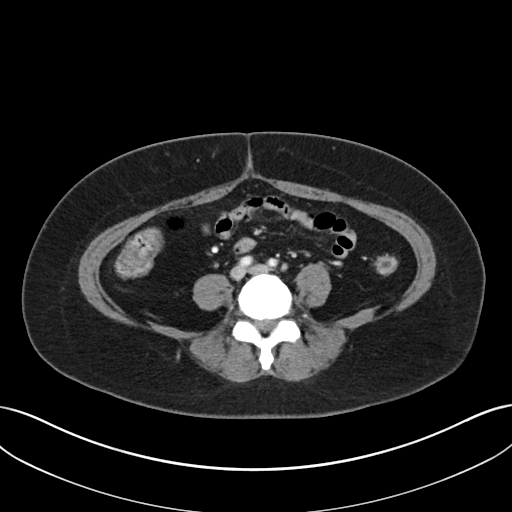
[im 59/106  soft-tissue]
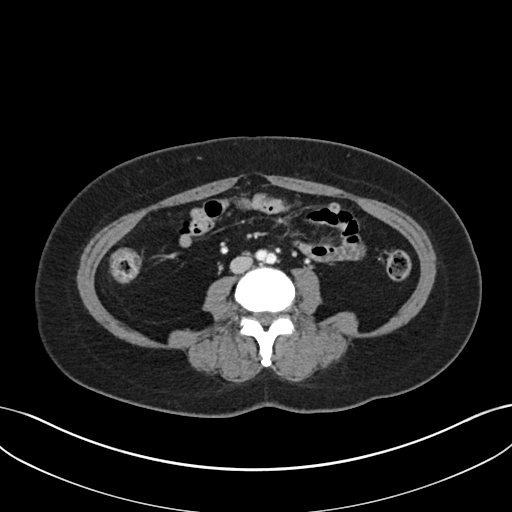
[im 71/106  soft-tissue]
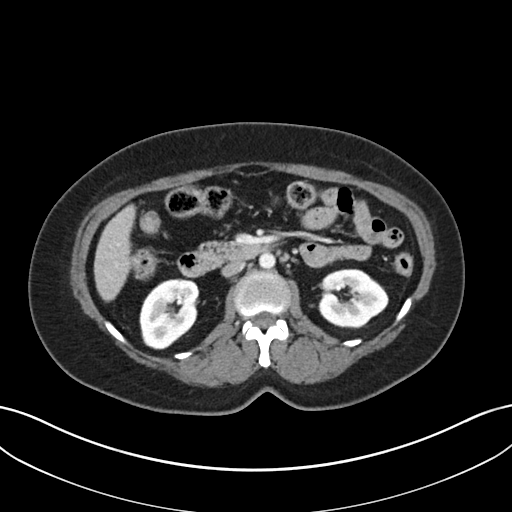
[im 71/106  bone]
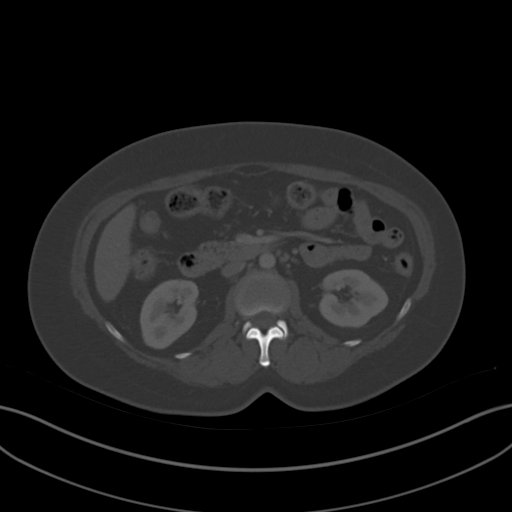
[im 76/106  soft-tissue]
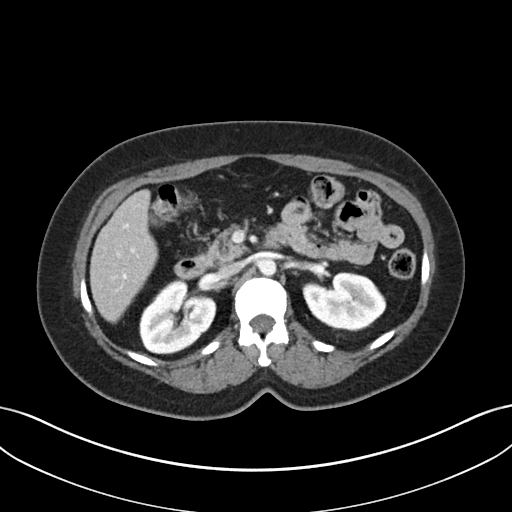
[im 82/106  soft-tissue]
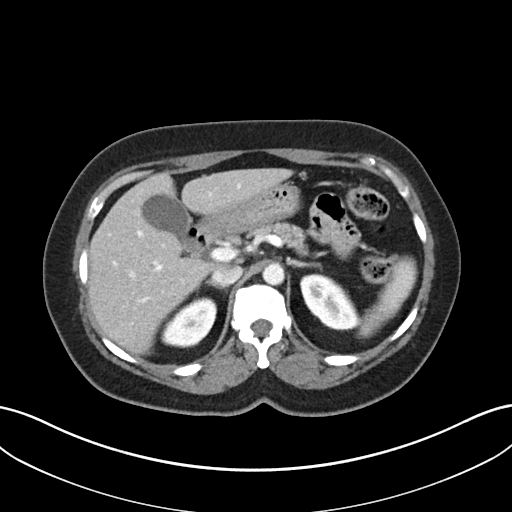
[im 94/106  soft-tissue]
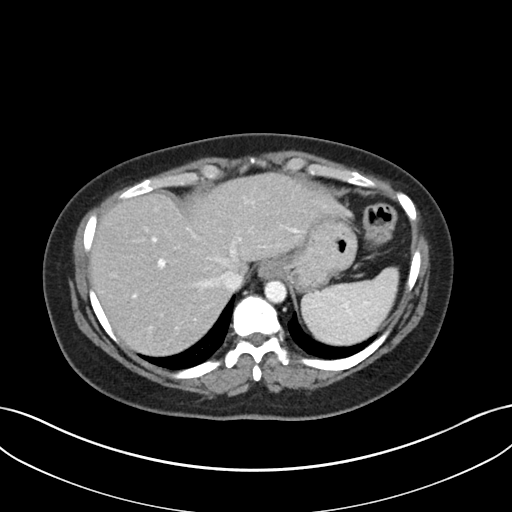
[im 100/106  soft-tissue]
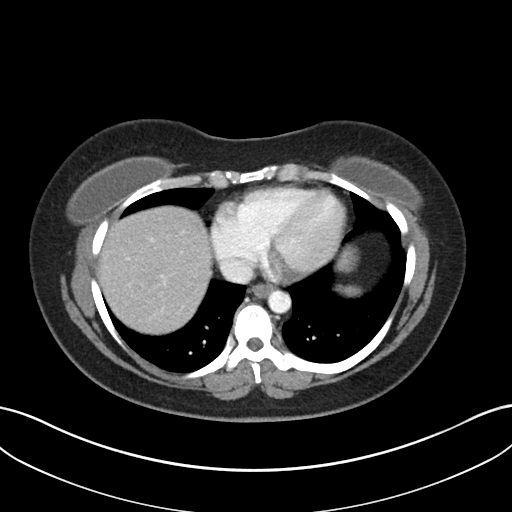

[Series 6: coronal soft tissue · coronal · 0.77mm/px · 3 of 82 slices shown]
[im 28/82  soft-tissue]
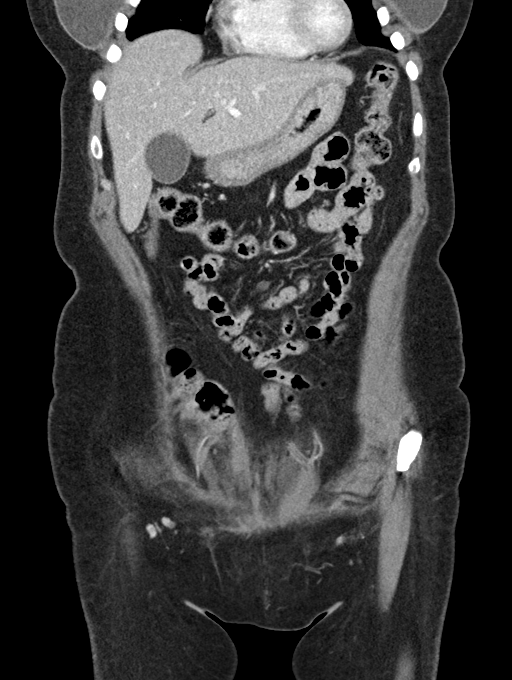
[im 37/82  soft-tissue]
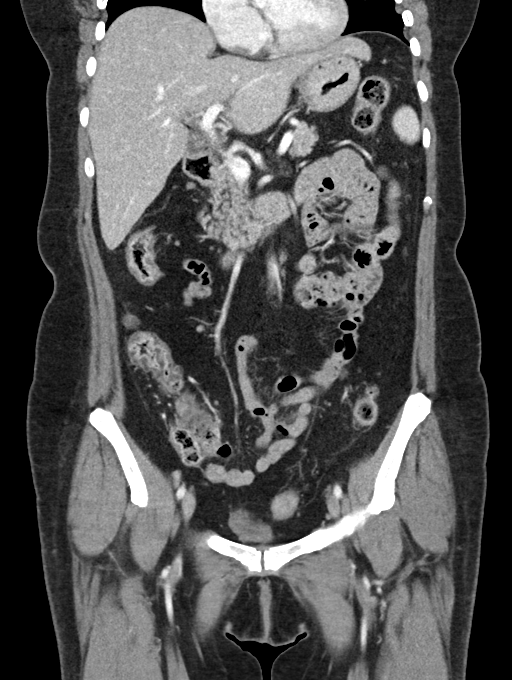
[im 46/82  soft-tissue]
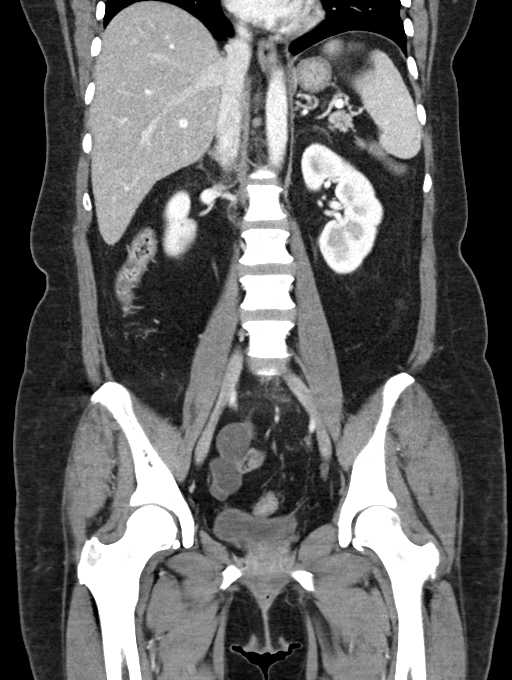

[16 of 46 positions shown; findings below may reference images not displayed]

FINDINGS: Lower chest: No acute abnormality.

Hepatobiliary: No solid liver abnormality is seen. Hepatic
steatosis. No gallstones, gallbladder wall thickening, or biliary
dilatation.

Pancreas: Unremarkable. No pancreatic ductal dilatation or
surrounding inflammatory changes.

Spleen: Normal in size without significant abnormality.

Adrenals/Urinary Tract: Adrenal glands are unremarkable. Kidneys are
normal, without renal calculi, solid lesion, or hydronephrosis.
Bladder is unremarkable.

Stomach/Bowel: Stomach is within normal limits. Appendix appears
normal. No evidence of bowel wall thickening, distention, or
inflammatory changes.

Vascular/Lymphatic: No significant vascular findings are present. No
enlarged abdominal or pelvic lymph nodes.

Reproductive: Status post hysterectomy. Multiple fluid attenuation
cysts of the right ovary, unchanged compared to prior examination
dated 9489 (series 3, image 75).

Other: No abdominal wall hernia or abnormality. No abdominopelvic
ascites.

Musculoskeletal: No acute or significant osseous findings.
IMPRESSION: 1. No CT findings of the abdomen or pelvis to explain abdominal pain
or dysuria.

2.  Status post hysterectomy.

3.  Hepatic steatosis.

## 2021-04-21 ENCOUNTER — Other Ambulatory Visit: Payer: Self-pay | Admitting: Internal Medicine

## 2021-04-21 DIAGNOSIS — E785 Hyperlipidemia, unspecified: Secondary | ICD-10-CM

## 2021-05-04 DIAGNOSIS — U071 COVID-19: Secondary | ICD-10-CM

## 2021-05-04 HISTORY — DX: COVID-19: U07.1

## 2022-02-27 ENCOUNTER — Other Ambulatory Visit: Payer: Self-pay | Admitting: Internal Medicine

## 2022-02-27 DIAGNOSIS — E785 Hyperlipidemia, unspecified: Secondary | ICD-10-CM

## 2022-04-07 ENCOUNTER — Ambulatory Visit
Admission: RE | Admit: 2022-04-07 | Discharge: 2022-04-07 | Disposition: A | Discharge status: Home / Self Care | Payer: BC Managed Care – PPO | Source: Ambulatory Visit | Attending: Internal Medicine | Admitting: Internal Medicine

## 2022-04-07 DIAGNOSIS — E785 Hyperlipidemia, unspecified: Secondary | ICD-10-CM

## 2022-11-27 ENCOUNTER — Encounter (HOSPITAL_BASED_OUTPATIENT_CLINIC_OR_DEPARTMENT_OTHER): Payer: Self-pay | Admitting: Surgery

## 2022-11-28 ENCOUNTER — Ambulatory Visit: Payer: Self-pay | Admitting: Surgery

## 2022-11-29 ENCOUNTER — Ambulatory Visit: Payer: Self-pay | Admitting: Surgery

## 2022-11-29 DIAGNOSIS — Z01818 Encounter for other preprocedural examination: Secondary | ICD-10-CM

## 2022-11-30 ENCOUNTER — Encounter (HOSPITAL_BASED_OUTPATIENT_CLINIC_OR_DEPARTMENT_OTHER): Payer: Self-pay | Admitting: Surgery

## 2022-11-30 NOTE — Progress Notes (Signed)
Spoke w/ via phone for pre-op interview--- pt Lab needs dos----   CBCdiff/plts, bmp            Lab results------ no COVID test -----patient states asymptomatic no test needed Arrive at ------- 1030 on 12-04-2022 NPO after MN NO Solid Food.  Clea r liquids from MN until--- 0930 Med rec completed Medications to take morning of surgery ----- effexor, claritin, protonix, myrbetriq Diabetic medication ----- n/a Patient instructed no nail polish to be worn day of surgery Patient instructed to bring photo id and insurance card day of surgery Patient aware to have Driver (ride ) / caregiver    for 24 hours after surgery -- sig other, kenneth Patient Special Instructions ----- pt verbalized understanding to do one fleet enema night before and morning of surgery per surgeon order Pre-Op special Istructions ----- n/a Patient verbalized understanding of instructions that were given at this phone interview. Patient denies shortness of breath, chest pain, fever, cough at this phone interview.

## 2022-12-04 ENCOUNTER — Ambulatory Visit (HOSPITAL_BASED_OUTPATIENT_CLINIC_OR_DEPARTMENT_OTHER): Payer: BC Managed Care – PPO | Admitting: Anesthesiology

## 2022-12-04 ENCOUNTER — Encounter (HOSPITAL_BASED_OUTPATIENT_CLINIC_OR_DEPARTMENT_OTHER)
Admission: RE | Disposition: A | Discharge status: Home / Self Care | Payer: Self-pay | Source: Home / Self Care | Attending: Surgery

## 2022-12-04 ENCOUNTER — Ambulatory Visit (HOSPITAL_BASED_OUTPATIENT_CLINIC_OR_DEPARTMENT_OTHER)
Admission: RE | Admit: 2022-12-04 | Discharge: 2022-12-04 | Disposition: A | Discharge status: Home / Self Care | Payer: BC Managed Care – PPO | Attending: Surgery | Admitting: Surgery

## 2022-12-04 ENCOUNTER — Other Ambulatory Visit: Payer: Self-pay

## 2022-12-04 ENCOUNTER — Encounter (HOSPITAL_BASED_OUTPATIENT_CLINIC_OR_DEPARTMENT_OTHER): Payer: Self-pay | Admitting: Surgery

## 2022-12-04 DIAGNOSIS — K648 Other hemorrhoids: Secondary | ICD-10-CM | POA: Diagnosis not present

## 2022-12-04 DIAGNOSIS — K640 First degree hemorrhoids: Secondary | ICD-10-CM | POA: Insufficient documentation

## 2022-12-04 DIAGNOSIS — Z87891 Personal history of nicotine dependence: Secondary | ICD-10-CM | POA: Insufficient documentation

## 2022-12-04 DIAGNOSIS — K644 Residual hemorrhoidal skin tags: Secondary | ICD-10-CM | POA: Insufficient documentation

## 2022-12-04 DIAGNOSIS — Z01818 Encounter for other preprocedural examination: Secondary | ICD-10-CM

## 2022-12-04 HISTORY — DX: Urgency of urination: R39.15

## 2022-12-04 HISTORY — DX: Unspecified hemorrhoids: K64.9

## 2022-12-04 HISTORY — DX: Personal history of other diseases of the female genital tract: Z87.42

## 2022-12-04 HISTORY — DX: Frequency of micturition: R35.0

## 2022-12-04 HISTORY — DX: Other chronic pain: G89.29

## 2022-12-04 HISTORY — DX: Low back pain, unspecified: M54.50

## 2022-12-04 HISTORY — PX: RECTAL EXAM UNDER ANESTHESIA: SHX6399

## 2022-12-04 HISTORY — PX: HEMORRHOID SURGERY: SHX153

## 2022-12-04 HISTORY — DX: Other complications of anesthesia, initial encounter: T88.59XA

## 2022-12-04 HISTORY — DX: Other nonspecific abnormal finding of lung field: R91.8

## 2022-12-04 LAB — CBC WITH DIFFERENTIAL/PLATELET
Abs Immature Granulocytes: 0.02 10*3/uL (ref 0.00–0.07)
Basophils Absolute: 0 10*3/uL (ref 0.0–0.1)
Basophils Relative: 1 %
Eosinophils Absolute: 0.1 10*3/uL (ref 0.0–0.5)
Eosinophils Relative: 2 %
HCT: 33.8 % — ABNORMAL LOW (ref 36.0–46.0)
Hemoglobin: 10.8 g/dL — ABNORMAL LOW (ref 12.0–15.0)
Immature Granulocytes: 0 %
Lymphocytes Relative: 39 %
Lymphs Abs: 3 10*3/uL (ref 0.7–4.0)
MCH: 27.9 pg (ref 26.0–34.0)
MCHC: 32 g/dL (ref 30.0–36.0)
MCV: 87.3 fL (ref 80.0–100.0)
Monocytes Absolute: 0.3 10*3/uL (ref 0.1–1.0)
Monocytes Relative: 4 %
Neutro Abs: 4.1 10*3/uL (ref 1.7–7.7)
Neutrophils Relative %: 54 %
Platelets: 294 10*3/uL (ref 150–400)
RBC: 3.87 MIL/uL (ref 3.87–5.11)
RDW: 15.8 % — ABNORMAL HIGH (ref 11.5–15.5)
WBC: 7.6 10*3/uL (ref 4.0–10.5)
nRBC: 0 % (ref 0.0–0.2)

## 2022-12-04 LAB — BASIC METABOLIC PANEL
Anion gap: 9 (ref 5–15)
BUN: 12 mg/dL (ref 6–20)
CO2: 25 mmol/L (ref 22–32)
Calcium: 8.9 mg/dL (ref 8.9–10.3)
Chloride: 103 mmol/L (ref 98–111)
Creatinine, Ser: 0.5 mg/dL (ref 0.44–1.00)
GFR, Estimated: >60 mL/min (ref >60)
Glucose, Bld: 109 mg/dL — ABNORMAL HIGH (ref 70–99)
Potassium: 3.9 mmol/L (ref 3.5–5.1)
Sodium: 137 mmol/L (ref 135–145)

## 2022-12-04 SURGERY — HEMORRHOIDECTOMY
Anesthesia: General | Site: Rectum

## 2022-12-04 MED ORDER — LIDOCAINE 2% (20 MG/ML) 5 ML SYRINGE
INTRAMUSCULAR | Status: DC | PRN
  Administered 2022-12-04: 60 mg via INTRAVENOUS

## 2022-12-04 MED ORDER — MEPERIDINE HCL 25 MG/ML IJ SOLN: 6.2500 mg | INTRAMUSCULAR | Status: DC | PRN

## 2022-12-04 MED ORDER — MIDAZOLAM HCL 2 MG/2ML IJ SOLN
INTRAMUSCULAR | Status: DC | PRN
  Administered 2022-12-04: 2 mg via INTRAVENOUS

## 2022-12-04 MED ORDER — HYDROMORPHONE HCL 1 MG/ML IJ SOLN: 0.2500 mg | INTRAMUSCULAR | Status: DC | PRN

## 2022-12-04 MED ORDER — ROCURONIUM BROMIDE 10 MG/ML (PF) SYRINGE
PREFILLED_SYRINGE | INTRAVENOUS | Status: DC | PRN
  Administered 2022-12-04: 50 mg via INTRAVENOUS

## 2022-12-04 MED ORDER — HYDROCODONE-ACETAMINOPHEN 5-325 MG PO TABS: 1.0000 | ORAL_TABLET | Freq: Four times a day (QID) | ORAL | 0 refills | Status: AC | PRN

## 2022-12-04 MED ORDER — OXYCODONE HCL 5 MG/5ML PO SOLN: 5.0000 mg | Freq: Once | ORAL | Status: DC | PRN

## 2022-12-04 MED ORDER — FLEET ENEMA 7-19 GM/118ML RE ENEM: 1.0000 | ENEMA | Freq: Once | RECTAL | Status: DC

## 2022-12-04 MED ORDER — PHENYLEPHRINE 80 MCG/ML (10ML) SYRINGE FOR IV PUSH (FOR BLOOD PRESSURE SUPPORT)
PREFILLED_SYRINGE | INTRAVENOUS | Status: DC | PRN
  Administered 2022-12-04: 160 ug via INTRAVENOUS
  Administered 2022-12-04: 80 ug via INTRAVENOUS

## 2022-12-04 MED ORDER — ACETAMINOPHEN 500 MG PO TABS
1000.0000 mg | ORAL_TABLET | ORAL | Status: AC
  Administered 2022-12-04: 1000 mg via ORAL

## 2022-12-04 MED ORDER — LACTATED RINGERS IV SOLN: INTRAVENOUS | Status: DC

## 2022-12-04 MED ORDER — BUPIVACAINE LIPOSOME 1.3 % IJ SUSP: 20.0000 mL | Freq: Once | INTRAMUSCULAR | Status: DC

## 2022-12-04 MED ORDER — PROMETHAZINE HCL 25 MG/ML IJ SOLN: 6.2500 mg | INTRAMUSCULAR | Status: DC | PRN

## 2022-12-04 MED ORDER — BUPIVACAINE LIPOSOME 1.3 % IJ SUSP
INTRAMUSCULAR | Status: DC | PRN
  Administered 2022-12-04: 40 mL

## 2022-12-04 MED ORDER — 0.9 % SODIUM CHLORIDE (POUR BTL) OPTIME
TOPICAL | Status: DC | PRN
  Administered 2022-12-04: 500 mL

## 2022-12-04 MED ORDER — FENTANYL CITRATE (PF) 100 MCG/2ML IJ SOLN
INTRAMUSCULAR | Status: DC | PRN
  Administered 2022-12-04: 25 ug via INTRAVENOUS
  Administered 2022-12-04: 50 ug via INTRAVENOUS
  Administered 2022-12-04: 25 ug via INTRAVENOUS

## 2022-12-04 MED ORDER — OXYCODONE HCL 5 MG PO TABS: 5.0000 mg | ORAL_TABLET | Freq: Once | ORAL | Status: DC | PRN

## 2022-12-04 MED ORDER — PROPOFOL 10 MG/ML IV BOLUS
INTRAVENOUS | Status: DC | PRN
  Administered 2022-12-04: 150 mg via INTRAVENOUS

## 2022-12-04 MED ORDER — ONDANSETRON HCL 4 MG/2ML IJ SOLN
INTRAMUSCULAR | Status: DC | PRN
  Administered 2022-12-04: 4 mg via INTRAVENOUS

## 2022-12-04 MED ORDER — AMISULPRIDE (ANTIEMETIC) 5 MG/2ML IV SOLN: 10.0000 mg | Freq: Once | INTRAVENOUS | Status: DC | PRN

## 2022-12-04 MED ORDER — EPHEDRINE SULFATE-NACL 50-0.9 MG/10ML-% IV SOSY
PREFILLED_SYRINGE | INTRAVENOUS | Status: DC | PRN
  Administered 2022-12-04 (×2): 10 mg via INTRAVENOUS

## 2022-12-04 MED ORDER — DEXAMETHASONE SODIUM PHOSPHATE 10 MG/ML IJ SOLN
INTRAMUSCULAR | Status: DC | PRN
  Administered 2022-12-04 (×2): 5 mg via INTRAVENOUS

## 2022-12-04 MED ORDER — SUGAMMADEX SODIUM 200 MG/2ML IV SOLN
INTRAVENOUS | Status: DC | PRN
  Administered 2022-12-04: 200 mg via INTRAVENOUS

## 2022-12-04 SURGICAL SUPPLY — 75 items
APL SKNCLS STERI-STRIP NONHPOA (GAUZE/BANDAGES/DRESSINGS) ×1
BENZOIN TINCTURE PRP APPL 2/3 (GAUZE/BANDAGES/DRESSINGS) ×1 IMPLANT
BLADE EXTENDED COATED 6.5IN (ELECTRODE) ×1 IMPLANT
BLADE SURG 10 STRL SS (BLADE) IMPLANT
BLADE SURG 15 STRL LF DISP TIS (BLADE) ×1 IMPLANT
BLADE SURG 15 STRL SS (BLADE) ×1
BRIEF MESH DISP LRG (UNDERPADS AND DIAPERS) ×1 IMPLANT
COVER BACK TABLE 60X90IN (DRAPES) ×1 IMPLANT
COVER MAYO STAND STRL (DRAPES) ×1 IMPLANT
DRAPE HYSTEROSCOPY (MISCELLANEOUS) IMPLANT
DRAPE LAPAROTOMY 100X72 PEDS (DRAPES) ×1 IMPLANT
DRAPE SHEET LG 3/4 BI-LAMINATE (DRAPES) IMPLANT
DRAPE UTILITY XL STRL (DRAPES) ×1 IMPLANT
ELECT REM PT RETURN 9FT ADLT (ELECTROSURGICAL) ×1
ELECTRODE REM PT RTRN 9FT ADLT (ELECTROSURGICAL) ×1 IMPLANT
GAUZE 4X4 16PLY ~~LOC~~+RFID DBL (SPONGE) ×1 IMPLANT
GAUZE PAD ABD 8X10 STRL (GAUZE/BANDAGES/DRESSINGS) ×1 IMPLANT
GAUZE SPONGE 4X4 12PLY STRL (GAUZE/BANDAGES/DRESSINGS) ×1 IMPLANT
GLOVE BIO SURGEON STRL SZ7.5 (GLOVE) ×1 IMPLANT
GLOVE INDICATOR 8.0 STRL GRN (GLOVE) ×1 IMPLANT
GOWN STRL REUS W/ TWL LRG LVL3 (GOWN DISPOSABLE) IMPLANT
GOWN STRL REUS W/TWL LRG LVL3 (GOWN DISPOSABLE) ×2
GOWN STRL REUS W/TWL XL LVL3 (GOWN DISPOSABLE) ×1 IMPLANT
HYDROGEN PEROXIDE 16OZ (MISCELLANEOUS) IMPLANT
IV CATH 14GX2 1/4 (CATHETERS) IMPLANT
IV CATH 18G SAFETY (IV SOLUTION) IMPLANT
KIT SIGMOIDOSCOPE (SET/KITS/TRAYS/PACK) IMPLANT
KIT TURNOVER CYSTO (KITS) ×1 IMPLANT
LEGGING LITHOTOMY PAIR STRL (DRAPES) IMPLANT
LIGASURE 7.4 SM JAW OPEN (ELECTROSURGICAL) IMPLANT
LOOP VASCLR MAXI BLUE 18IN ST (MISCELLANEOUS) IMPLANT
LOOP VASCULAR MAXI 18 BLUE (MISCELLANEOUS)
LOOPS VASCLR MAXI BLUE 18IN ST (MISCELLANEOUS) IMPLANT
NDL HYPO 22X1.5 SAFETY MO (MISCELLANEOUS) ×1 IMPLANT
NDL HYPO 25X1 1.5 SAFETY (NEEDLE) IMPLANT
NDL SAFETY ECLIP 18X1.5 (MISCELLANEOUS) IMPLANT
NEEDLE HYPO 22X1.5 SAFETY MO (MISCELLANEOUS) ×1 IMPLANT
NEEDLE HYPO 25X1 1.5 SAFETY (NEEDLE) IMPLANT
NEEDLE SAFETY HYPO 22GAX1.5 (MISCELLANEOUS) ×1
NS IRRIG 500ML POUR BTL (IV SOLUTION) ×1 IMPLANT
PACK BASIN DAY SURGERY FS (CUSTOM PROCEDURE TRAY) ×1 IMPLANT
PAD ARMBOARD 7.5X6 YLW CONV (MISCELLANEOUS) IMPLANT
PENCIL SMOKE EVACUATOR (MISCELLANEOUS) ×1 IMPLANT
SLEEVE SCD COMPRESS KNEE MED (STOCKING) ×1 IMPLANT
SPIKE FLUID TRANSFER (MISCELLANEOUS) ×1 IMPLANT
SPONGE HEMORRHOID 8X3CM (HEMOSTASIS) IMPLANT
SPONGE SURGIFOAM ABS GEL 100 (HEMOSTASIS) IMPLANT
SPONGE SURGIFOAM ABS GEL 12-7 (HEMOSTASIS) IMPLANT
SUT CHROMIC 2 0 SH (SUTURE) IMPLANT
SUT CHROMIC 3 0 SH 27 (SUTURE) IMPLANT
SUT MNCRL AB 4-0 PS2 18 (SUTURE) IMPLANT
SUT SILK 0 TIES 10X30 (SUTURE) ×1 IMPLANT
SUT SILK 2 0 (SUTURE)
SUT SILK 2 0 SH (SUTURE) ×1 IMPLANT
SUT SILK 2-0 18XBRD TIE 12 (SUTURE) IMPLANT
SUT VIC AB 2-0 SH 27 (SUTURE)
SUT VIC AB 2-0 SH 27XBRD (SUTURE) IMPLANT
SUT VIC AB 2-0 UR6 27 (SUTURE) ×1 IMPLANT
SUT VIC AB 3-0 SH 18 (SUTURE) IMPLANT
SUT VIC AB 3-0 SH 27 (SUTURE) ×2
SUT VIC AB 3-0 SH 27X BRD (SUTURE) ×1 IMPLANT
SUT VIC AB 3-0 SH 27XBRD (SUTURE) IMPLANT
SUT VIC AB 4-0 P-3 18XBRD (SUTURE) IMPLANT
SUT VIC AB 4-0 P3 18 (SUTURE)
SYR 20ML LL LF (SYRINGE) IMPLANT
SYR BULB EAR ULCER 3OZ GRN STR (SYRINGE) IMPLANT
SYR BULB IRRIG 60ML STRL (SYRINGE) ×1 IMPLANT
SYR CONTROL 10ML LL (SYRINGE) ×1 IMPLANT
SYR TB 1ML LL NO SAFETY (SYRINGE) IMPLANT
TOWEL OR 17X26 10 PK STRL BLUE (TOWEL DISPOSABLE) ×1 IMPLANT
TRAY DSU PREP LF (CUSTOM PROCEDURE TRAY) ×1 IMPLANT
TUBE CONNECTING 12X1/4 (SUCTIONS) ×1 IMPLANT
VASCULAR TIE MAXI BLUE 18IN ST (MISCELLANEOUS)
WATER STERILE IRR 500ML POUR (IV SOLUTION) IMPLANT
YANKAUER SUCT BULB TIP NO VENT (SUCTIONS) ×1 IMPLANT

## 2022-12-04 NOTE — Discharge Instructions (Addendum)
ANORECTAL SURGERY: POST OP INSTRUCTIONS  DIET: Follow a light bland diet the first 24 hours after arrival home, such as soup, liquids, crackers, etc.  Be sure to include lots of fluids daily.  Avoid fast food or heavy meals as your are more likely to get nauseated.  Eat a low fat diet the next few days after surgery.   Some bleeding with bowel movements is expected for the first couple of days but this should stop in between bowel movements  Take your usually prescribed home medications unless otherwise directed. No foreign bodies per rectum for the next 3 months (enemas, etc)  PAIN CONTROL: It is helpful to take an over-the-counter pain medication regularly for the first few days/weeks.  Choose from the following that works best for you: Ibuprofen (Advil, etc) Three 254m tabs every 6 hours as needed. You are being prescribed hydrocodone (previously tolerated) as you are also taking a medication that may interact with tramadol. Hydrocodone has tylenol added to it so you should not take additional over the counter tylenol within a day as taking your prescribed hydrocodone If you are having problems/concerns with the prescription medicine, please call [place] further advice.  Avoid getting constipated.  Between the surgery and the pain medications, it is common to experience some constipation.  Increasing fluid intake (64oz of water per day) and taking a fiber supplement (such as Metamucil, Citrucel, FiberCon) 1-2 times a day regularly will usually help prevent this problem from occurring.  Take Miralax (over the counter) 1-2x/day while taking a narcotic pain medication. If no bowel movement after 48hours, you may additionally take a laxative like a bottle of Milk of Magnesia which can be purchased over the counter. Avoid enemas.   Watch out for diarrhea.  If you have many loose bowel movements, simplify your diet to bland foods.  Stop any stool softeners and decrease your fiber supplement. If this  worsens or does not improve, please call [place]  Wash / shower every day.  If you were discharged with a dressing, you may remove this the day after your surgery. You may shower normally, getting soap/water on your wound, particularly after bowel movements.  Soaking in a warm bath filled a couple inches ("Sitz bath") is a great way to clean the area after a bowel movement and many patients find it is a way to soothe the area.  ACTIVITIES as tolerated:   You may resume regular (light) daily activities beginning the next day--such as daily self-care, walking, climbing stairs--gradually increasing activities as tolerated.  If you can walk 30 minutes without difficulty, it is safe to try more intense activity such as jogging, treadmill, bicycling, low-impact aerobics, etc. Refrain from any heavy lifting or straining for the first 2 weeks after your procedure, particularly if your surgery was for hemorrhoids. Avoid activities that make your pain worse You may drive when you are no longer taking prescription pain medication, you can comfortably wear a seatbelt, and you can safely maneuver your car and apply brakes.  FOLLOW UP in our office Please call [facility] at [phone] to set up an appointment to see your surgeon in the office for a follow-up appointment approximately 2 weeks after your surgery. Make sure that you call for this appointment the day you arrive home to insure a convenient appointment time.  9. If you have disability or family leave forms that need to be completed, you may have them completed by your primary care physician's office; for return to work  instructions, please ask our office staff and they will be happy to assist you in obtaining this documentation   When to call us [phone]: Poor pain control Reactions / problems with new medications (rash/itching, etc)  Fever over 101.5 F (38.5 C) Inability to urinate Nausea/vomiting Worsening swelling or bruising Continued  bleeding from incision. Increased pain, redness, or drainage from the incision  The clinic staff is available to answer your questions during regular business hours (8:30am-5pm).  Please don't hesitate to call and ask to speak to one of our nurses for clinical concerns.   A surgeon from [facility] is always on call at the hospitals   If you have a medical emergency, go to the nearest emergency room or call 911.   [facility] [address] [place], [place], [place]  [address] MAIN: [phone] FAX: [phone] [email or web address] Post Anesthesia Home Care Instructions  Activity: Get plenty of rest for the remainder of the day. A responsible adult should stay with you for 24 hours following the procedure.  For the next 24 hours, DO NOT: -Drive a car -Paediatric nurse -Drink alcoholic beverages -Take any medication unless instructed by your physician -Make any legal decisions or sign important papers.  Meals: Start with liquid foods such as gelatin or soup. Progress to regular foods as tolerated. Avoid greasy, spicy, heavy foods. If nausea and/or vomiting occur, drink only clear liquids until the nausea and/or vomiting subsides. Call your physician if vomiting continues.  Special Instructions/Symptoms: Your throat may feel dry or sore from the anesthesia or the breathing tube placed in your throat during surgery. If this causes discomfort, gargle with warm salt water. The discomfort should disappear within 24 hours.   Information for Discharge Teaching: EXPAREL (bupivacaine liposome injectable suspension)   Your surgeon gave you EXPAREL(bupivacaine) in your surgical incision to help control your pain after surgery.  EXPAREL is a local anesthetic that provides pain relief by numbing the tissue around the surgical site. EXPAREL is designed to release pain medication over time and can control pain for up to 72  hours. Depending on how you respond to EXPAREL, you may require less pain medication during your recovery.  Possible side effects: Temporary loss of sensation or ability to move in the area where bupivacaine was injected. Nausea, vomiting, constipation Rarely, numbness and tingling in your mouth or lips, lightheadedness, or anxiety may occur. Call your doctor right away if you think you may be experiencing any of these sensations, or if you have other questions regarding possible side effects.  Follow all other discharge instructions given to you by your surgeon or nurse. Eat a healthy diet and drink plenty of water or other fluids.  If you return to the hospital for any reason within 96 hours following the administration of EXPAREL, please inform your health care providers.

## 2022-12-04 NOTE — H&P (Signed)
CC: Here today for surgery  HPI: [name], JD is an 52 y.o. female with history of GERD, seasonal allergies, whom is seen in the office today as a referral by Dr. Gita Kudo for evaluation of hemorrhoids.  Reports a history of hemorrhoids dating back at least 10 years ago where she had banding and 3 separate sessions. At that time she was experiencing issues with swollen/prolapsing anal tissue. She noted improvement after the banding. She was referred to see [place] for evaluation of a hemorrhoidal tag that she has developed that is more external. It does cause issues with occasional itching and hygiene, difficult to get cleaned due to this redundant tissue. She notices that whenever this residual tag rubs on anything and is quite uncomfortable, has issues with drying out and/or getting 'irritated.'  She reports that she is taking Benefiber twice daily and has been doing so for quite a while. She drinks upwards of 128 ounces of water per day. She has a bowel movement every 2 to 3 days and reports the stool is variable in consistency, more recently more firm. She spends generally 5 to 10 minutes on the commode. No significant rectal bleeding.  Last colonoscopy 12/2019 Dr. Rush Landmark - - Hemorrhoids and skin tags found on digital rectal exam. - Stool in the entire examined colon Lavaged with adequate visualization. - The examined portion of the ileum was normal. - Normal mucosa in the entire examined colon. - Non-bleeding non-thrombosed external and internal hemorrhoids.  Rare episodes where she might have gas and/or liquid stool incontinence but does not have to wear a pad/diaper for that purpose. We spent time discussing hygiene related expectations and her main concern has been the discomfort at this tag has with regards to it rubbing on clothing.  INTERVAL HX She denies any changes in her health, health history, medications, allergies since we met in the office. States she is ready for surgery.     PMH: GERD, seasonal allergies. Reports issue with succinylcholine-takes a long time to recover from this.  PSH: Hemorrhoidal banding remotely, x 3. Back surgery, ~5; hysterectomy, 2008; breast augmentation, 05/2021; tonsillectomy, 1975.  FHx: Denies any known family history of colorectal, breast, endometrial or ovarian cancer  Social Hx: Denies use of tobacco/EtOH/illicit drug. She works as a Restaurant manager, fast food   Past Medical History:  Diagnosis Date   Anxiety    Chronic low back pain    Complication of anesthesia    hard to wake due to succinycholine  ( per pt does not have enyzme that breaks this done)   Frequency of urination    GERD (gastroesophageal reflux disease)    Headache    Hemorrhoids    internal and external   History of pelvic inflammatory disease    Hyperlipidemia    Lumbar herniated disc    per pt L4--5   Overactive bladder    urologist--- dr winter   Prediabetes    Pulmonary nodules    stable since 2015, follows with [facility]   Seasonal allergies    Urgency of urination     Past Surgical History:  Procedure Laterality Date   BREAST ENHANCEMENT SURGERY Bilateral 2002   BUNIONECTOMY Left 2019   CERVICAL CONIZATION W/BX  1990   dr ray   COLONOSCOPY  12/29/2019   dr Rush Landmark   EXPLORATORY LAPAROTOMY  1995   by dr ray;   right ovarian cystectomy   HEMORRHOID BANDING     x 3   LAPAROTOMY  07/01/2003   @[facility]  by dr Ubaldo Glassing;  started w/ laparoscopy turned laparotomy due to extensive adhesions ;  left ovarian cystectomy (adnexal mass),  extensive lysis adhesions left salpingo and left ovary repair   LUMBAR LAMINECTOMY/DECOMPRESSION MICRODISCECTOMY  06/13/2002   @[facility]  by dr Vertell Limber;  bilateral lam L5--S1 and left L5--S1 disectomy   SUPRACERVICAL ABDOMINAL HYSTERECTOMY  08/27/2007   @ [facility] by dr lomax;   w/ BILATERAL SALPINGOOPHORECTOMY AND REVISION MULTIPLE ABDOMINAL SCARS   TONSILLECTOMY AND ADENOIDECTOMY     child    Family History  Problem  Relation Age of Onset   Lung cancer Mother        smoked   Allergies Mother    Cancer Mother    Diabetes Mother    Hyperlipidemia Mother    Hyperlipidemia Father    Skin cancer Father    Cancer Maternal Grandmother    Heart disease Maternal Grandfather    Cancer Paternal Grandmother    Cancer Paternal Grandfather    Heart disease Paternal Grandfather    Lung cancer Maternal Aunt        smoked   Lung cancer Maternal Uncle        never smoker   Colon cancer Maternal Uncle    Breast cancer Paternal Aunt    Colon cancer Cousin    Colon cancer Cousin    Esophageal cancer Neg Hx    Stomach cancer Neg Hx    Rectal cancer Neg Hx     Social:  reports that she quit smoking about 8 years ago. Her smoking use included cigarettes. She has a 2.50 pack-year smoking history. She has never used smokeless tobacco. She reports that she does not drink alcohol and does not use drugs.  Allergies:  Allergies  Allergen Reactions   Codeine Swelling    Hard to swallow    Succinylcholine     Hard to wake up from surgery     Medications: I have reviewed the patient's current medications.  No results found for this or any previous visit (from the past 48 hour(s)).  No results found.   PE Height 5' 1"$  (1.549 m), weight 68 kg. Constitutional: NAD; conversant Eyes: Moist conjunctiva Lungs: Normal respiratory effort CV: RRR Psychiatric: Appropriate affect  From our office visit 09/05/22: "Anorectal: Normal perianal skin. Anterior midline external hemorrhoidal tag that is currently decompressed. Small associated internal component. DRE - normal tone/squeeze, no palpable masses Anoscopy: Circumferential anoscopy demonstrates healthy-appearing anoderm. No evident anal fissures. No ulcerations. Minimal internal hemorrhoids-small component anterior midline. "   No results found for this or any previous visit (from the past 48 hour(s)).  No results found.   A/P: [name], JD is an  53 y.o. female with hx of GERD, seasonal allergies here for surgery re: hemorrhoidal tag-anterior midline with associated small internal hemorrhoid.  -We discussed the anatomy and physiology of the GI tract and pathophysiology of hemorrhoids with associated illustrations. She has reliably been taking twice daily fiber supplementation and drinks in excess of 128 ounces of water per day. We also discussed minimizing commode x 2 to 3 minutes. That said, the issues she is currently experiencing is primarily an anatomic issue with regards to this hemorrhoidal tag. She is comfortable with her control and continence and although we did touch base on possibility of pelvic floor physical therapy in the future, she does not feel she needs it at this juncture.  -We have reviewed options going forward including further observation vs surgery -  hemorrhoidectomy - anterior midline - removing this hemorrhoidal tag; anorectal exam under anesthesia  -The planned procedure, material risks (including, but not limited to, pain, bleeding, infection, scarring, need for blood transfusion, damage to anal sphincter, incontinence of gas and/or stool, need for additional procedures, anal stenosis, rare cases of pelvic sepsis which in severe cases may require things like a colostomy, recurrence, pneumonia, heart attack, stroke, death) benefits and alternatives to surgery were discussed at length. I noted a good probability that the procedure would help improve their symptoms. The patient's questions were answered to her satisfaction, she voiced understanding and elected to proceed with surgery. Additionally, we discussed typical postoperative expectations and the recovery process.   Nadeen Landau, [facility], [place]

## 2022-12-04 NOTE — Anesthesia Postprocedure Evaluation (Signed)
Anesthesia Post Note  Patient: [name], JD  Procedure(s) Performed: HEMORRHOIDECTOMY ANTERIOR MIDLINE (Rectum) ANORECTAL EXAM UNDER ANESTHESIA (Rectum)     Patient location during evaluation: PACU Anesthesia Type: General Level of consciousness: sedated and patient cooperative Pain management: pain level controlled Vital Signs Assessment: post-procedure vital signs reviewed and stable Respiratory status: spontaneous breathing Cardiovascular status: stable Anesthetic complications: no   No notable events documented.  Last Vitals:  Vitals:   12/04/22 1234 12/04/22 1314  BP:  (!) 150/74  Pulse: 77 80  Resp: 13 19  Temp: 36.7 C   SpO2: 96% 100%    Last Pain:  Vitals:   12/04/22 1234  TempSrc: Oral  PainSc:                  Nolon Nations

## 2022-12-04 NOTE — Anesthesia Procedure Notes (Signed)
Procedure Name: Intubation Date/Time: 12/04/2022 11:11 AM  Performed by: Suan Halter, CRNAPre-anesthesia Checklist: Patient identified, Emergency Drugs available, Suction available and Patient being monitored Patient Re-evaluated:Patient Re-evaluated prior to induction Oxygen Delivery Method: Circle system utilized Preoxygenation: Pre-oxygenation with 100% oxygen Induction Type: IV induction Ventilation: Mask ventilation without difficulty Laryngoscope Size: Mac and 3 Grade View: Grade I Tube type: Oral Tube size: 7.0 mm Number of attempts: 1 Airway Equipment and Method: Stylet and Oral airway Placement Confirmation: ETT inserted through vocal cords under direct vision, positive ETCO2 and breath sounds checked- equal and bilateral Secured at: 22 cm Tube secured with: Tape Dental Injury: Teeth and Oropharynx as per pre-operative assessment

## 2022-12-04 NOTE — Transfer of Care (Signed)
Immediate Anesthesia Transfer of Care Note  Patient: [name], JD  Procedure(s) Performed: Procedure(s) (LRB): HEMORRHOIDECTOMY ANTERIOR MIDLINE (N/A) ANORECTAL EXAM UNDER ANESTHESIA (N/A)  Patient Location: PACU  Anesthesia Type: General  Level of Consciousness: awake, oriented, sedated and patient cooperative  Airway & Oxygen Therapy: Patient Spontanous Breathing and Patient connected to face mask oxygen  Post-op Assessment: Report given to PACU RN and Post -op Vital signs reviewed and stable  Post vital signs: Reviewed and stable  Complications: No apparent anesthesia complications  Last Vitals:  Vitals Value Taken Time  BP 151/74 12/04/22 1203  Temp    Pulse 91 12/04/22 1205  Resp 15 12/04/22 1205  SpO2 100 % 12/04/22 1205  Vitals shown include unvalidated device data.  Last Pain:  Vitals:   12/04/22 1035  TempSrc: Oral  PainSc: 0-No pain      Patients Stated Pain Goal: 2 ([id number])  Complications: No notable events documented.

## 2022-12-04 NOTE — Op Note (Signed)
12/04/2022  12:03 PM  PATIENT:  [name], JD  52 y.o. female  Patient Care Team: Velna Hatchet, MD as PCP - General (Internal Medicine)  PRE-OPERATIVE DIAGNOSIS:  Hemorrhoids  POST-OPERATIVE DIAGNOSIS:  Same  PROCEDURE:   Hemorrhoidectomy - anterior midline Anorectal exam under anesthesia  SURGEON: Nadeen Landau, MD  ANESTHESIA:   local and general  SPECIMEN:  Anterior midline hemorrhoidal tissue  DISPOSITION OF SPECIMEN:  PATHOLOGY  COUNTS:  All sponge, needle, and instrument counts were reported correct at conclusion.  EBL: 10 mL  Drains: None  PLAN OF CARE: Discharge to home after PACU  PATIENT DISPOSITION:  PACU - hemodynamically stable.  OR FINDINGS: Anterior midline hemorrhoidal tissue with decompressed skin, small internal hemorrhoidal component.  No other findings on circumferential anoscopy aside from tiny/grade 1 internal hemorrhoids in both the right and left posterior positions.  Excision of the anterior midline hemorrhoidal tissue was carried out uneventfully.  DESCRIPTION: The patient was identified in the preoperative holding area and taken to the OR. SCDs were applied. She then underwent general endotracheal anesthesia without difficulty. She was then rolled onto the OR table in the prone jackknife position. Pressure points were then evaluated and padded. Benzoin was applied to the buttocks and they were gently taped apart.  She was then prepped and draped in usual sterile fashion.  A surgical timeout was performed indicating the correct patient, procedure, and positioning.  A perianal block was then created using a dilute mixture of 0.25% Marcaine with epinephrine and Exparel.  After ascertaining an appropriate level of anesthesia had been achieved, a well lubricated digital rectal exam was performed. This demonstrated no palpable masses.  A Hill-Ferguson anoscope was into the anal canal and circumferential inspection demonstrated healthy  appearing anoderm.  No fissures or ulceration.  There is significant hemorrhoidal component in the anterior midline that is a mixed internal/external hemorrhoid but soft and decompressed at present.  There are also tiny/grade 1 internal hemorrhoids in both the left and right posterior positions.  Given the symptoms being from her anterior hemorrhoidal component, we opted to leave these alone.  The anterior midline hemorrhoidal tissue was elevated with a forcep.  Borders of excision are marked and the perianal skin is incised sharply with a knife.  The hemorrhoidal tissue was dissected free of the underlying external sphincter muscle and hemorrhoidectomy carried out then using electrocautery.  No fibers of muscle were divided with this approach.  The hemorrhoid was fully excised and passed off the specimen.  Hemorrhoidal varicosities overlying the sphincter muscle are then carefully fulgurated.  Attention was then directed at wound closure.  We began with a running 3-0 Vicryl suture starting at the internal component with a figure-of-eight stitch and running it down to the level of the dentate line.  More externally, the external component is then approximated using a running 3-0 chromic suture.  The anal canal was then irrigated and hemostasis verified.  Additional local anesthetic is infiltrated around the excision site.  All sponge, needle, and instrument counts were reported correct.  The buttocks were then untaped.  A dressing consisting of 4 x 4's, ABD, and mesh underwear is utilized.  She was rolled back onto a stretcher, awakened from anesthesia, extubated, and transported to the recovery room in satisfactory condition.  DISPOSITION: PACU in satisfactory condition.

## 2022-12-04 NOTE — Anesthesia Preprocedure Evaluation (Addendum)
Anesthesia Evaluation  Patient identified by MRN, date of birth, ID band Patient awake    Reviewed: Allergy & Precautions, NPO status , Patient's Chart, lab work & pertinent test results  History of Anesthesia Complications (+) PSEUDOCHOLINESTERASE DEFICIENCY and history of anesthetic complications  Airway Mallampati: II  TM Distance: >3 FB Neck ROM: Full    Dental  (+) Dental Advisory Given, Teeth Intact   Pulmonary former smoker   Pulmonary exam normal breath sounds clear to auscultation       Cardiovascular negative cardio ROS Normal cardiovascular exam Rhythm:Regular Rate:Normal     Neuro/Psych  Headaches PSYCHIATRIC DISORDERS Anxiety        GI/Hepatic Neg liver ROS,GERD  ,,  Endo/Other  negative endocrine ROS    Renal/GU negative Renal ROS     Musculoskeletal negative musculoskeletal ROS (+)    Abdominal   Peds  Hematology negative hematology ROS (+)   Anesthesia Other Findings   Reproductive/Obstetrics                             Anesthesia Physical Anesthesia Plan  ASA: 2  Anesthesia Plan: General   Post-op Pain Management: Tylenol PO (pre-op)*   Induction: Intravenous  PONV Risk Score and Plan: 3 and Ondansetron, Dexamethasone, Treatment may vary due to age or medical condition and Midazolam  Airway Management Planned: Oral ETT  Additional Equipment:   Intra-op Plan:   Post-operative Plan: Extubation in OR  Informed Consent: I have reviewed the patients History and Physical, chart, labs and discussed the procedure including the risks, benefits and alternatives for the proposed anesthesia with the patient or authorized representative who has indicated his/her understanding and acceptance.     Dental advisory given  Plan Discussed with: CRNA  Anesthesia Plan Comments:        Anesthesia Quick Evaluation

## 2022-12-05 ENCOUNTER — Encounter (HOSPITAL_BASED_OUTPATIENT_CLINIC_OR_DEPARTMENT_OTHER): Payer: Self-pay | Admitting: Surgery

## 2022-12-07 LAB — SURGICAL PATHOLOGY

## 2023-06-22 ENCOUNTER — Other Ambulatory Visit (HOSPITAL_COMMUNITY): Payer: Self-pay | Admitting: Internal Medicine

## 2023-06-22 ENCOUNTER — Ambulatory Visit (HOSPITAL_COMMUNITY)
Admission: RE | Admit: 2023-06-22 | Discharge: 2023-06-22 | Disposition: A | Discharge status: Home / Self Care | Payer: BC Managed Care – PPO | Source: Ambulatory Visit | Attending: Internal Medicine | Admitting: Internal Medicine

## 2023-06-22 DIAGNOSIS — R519 Headache, unspecified: Secondary | ICD-10-CM | POA: Insufficient documentation

## 2024-03-09 NOTE — Progress Notes (Signed)
 Impression   1. Cough, unspecified type   2. Dyspnea, unspecified type   3. Fatigue, unspecified type   4. Chills   5. Body aches   6. Nausea      Plan     Patient's Medications       * Accurate as of Mar 09, 2024  4:14 PM. Reflects encounter med changes as of last refresh          Continued Medications      Instructions  CLARITIN-D 24 HOUR 10-240 MG per 24 hr tablet Generic drug: loratadine-pseudoephedrine  1 tablet, Daily   gabapentin 300 mg capsule Commonly known as: NEURONTIN  300 mg, Every evening   HYDROcodone -acetaminophen  10-325 mg per tablet Commonly known as: NORCO  TAKE ONE TABLET BY MOUTH EVERY 4 TO 6 HOURS AS NEEDED FOR breakthrough pain (not TO exceed FIVE PER DAY)   venlafaxine  HCl 25 mg tablet Commonly known as: EFFEXOR   25 mg, 2 times a day   Vitamin D (Cholecalciferol) 400 UNITS tablet Commonly known as: Vitamin D3         Orders Placed This Encounter  Procedures  . XR Chest Pa And Lateral   Underlying etiology may be viral, however patient has had severe fatigue, dyspnea with exertion and pain with deep breathing.  Did recommend emergency department evaluation and discussed this with patient.  Discussed limitations of urgent care workup and wide differential. Discussed concern for anemia given patient's history. Considered PE given dyspnea, lower risk given no tachycardia, tachypnea and no signs of DVT on exam. Cannot rule out underlying cardiac etiology including myocarditis. Patient does not wish to go to the emergency department at this time, she does state that she can see her primary care provider tomorrow.  Chest x-ray performed which did not show any pneumonia on initial read of x-ray, will notify patient of any discrepancies on final radiology read. AVS reviewed in clinic.   Risks, benefits, and alternatives of the medications and treatment plan prescribed today were discussed, and patient expressed understanding. Plan follow-up as  discussed or as needed if any worsening symptoms or change in condition.     Antionette FORBES [place], PA-C     Subjective   Patient ID:  [name] is a 53 y.o. (DOB [birth year]) female.     Patient presents with  . Fatigue    Sick for the last 1.5 weeks. States that her main complaint is fatigue. Also reports feeling short of breath with cough, and says that her voice comes and goes. Fever that comes and goes. Was exposed to bacterial pneumonia.     HPI: Patient presents with severe fatigue, cough, chills, body aches, low-grade fever onset about a week ago.  She is also having some increased dyspnea with exertion and pain with deep breathing, pain is primarily across lower ribs.  Cough is sometimes productive of gray/yellow mucus.  She does report that a month or 2 ago she had her labs checked and her hemoglobin was a little bit low.  Does report that she seems to bruise easily.  No known sick contacts.  Denies any SOB or CP at rest currently.  No history of VTE.  Denies any hemoptysis, orthopnea, lower extremity edema, palpitations, dizziness, lightheadedness, abdominal pain, vomiting or diarrhea, any abnormal bleeding.  Denies any other symptoms at this time.  Past Medical History, Past Surgery History, Allergies, Social History, and Family History were reviewed and updated.    ROS See HPI for pertinent positives  and negatives   Objective   BP 114/80 (BP Location: Left Upper Arm, Patient Position: Sitting)   Pulse 81   Temp 98.2 F (36.8 C) (Oral)   Resp 16   Ht 5' 1 (1.549 m)   Wt 155 lb (70.3 kg)   SpO2 98%   BMI 29.29 kg/m  Physical Exam Constitutional:      General: She is not in acute distress.    Appearance: Normal appearance. She is not ill-appearing, toxic-appearing or diaphoretic.  HENT:     Head: Normocephalic and atraumatic.     Nose: Congestion present. No rhinorrhea.     Mouth/Throat:     Mouth: Mucous membranes are moist.     Pharynx: Oropharynx is  clear. No oropharyngeal exudate or posterior oropharyngeal erythema.  Eyes:     Extraocular Movements: Extraocular movements intact.     Conjunctiva/sclera: Conjunctivae normal.     Pupils: Pupils are equal, round, and reactive to light.  Cardiovascular:     Rate and Rhythm: Normal rate and regular rhythm.     Pulses: Normal pulses.     Heart sounds: Normal heart sounds. No murmur heard. Musculoskeletal:     Cervical back: Normal range of motion and neck supple. No rigidity or tenderness.  Pulmonary:     Effort: Pulmonary effort is normal. No respiratory distress.     Breath sounds: Normal breath sounds. No stridor. No wheezing, rhonchi or rales.  Lymphadenopathy:     Cervical: No cervical adenopathy.  Skin:    General: Skin is warm and dry.     Capillary Refill: Capillary refill takes less than 2 seconds.     Findings: No rash.  Neurological:     Mental Status: She is alert and oriented to person, place, and time.      XR Chest Pa And Lateral [[id number]]  Resulted: 03/09/24 1543, Result status: Preliminary result   Resulted by: Antionette FORBES [place], PA-C Performed: 03/09/24 1527 - 03/09/24 1533  Accession number: [id number]  Narrative: Comparison: None  Brief History: Cough and dyspnea x 1 week  PA and lateral views of the chest were obtained.  INTERPRETATION: The cardiac silhouette is unremarkable. Pulmonary vasculature is normal. There is no evidence of pneumothorax or pleural effusion. The lungs are clear without evidence of focal consolidation. The osseous structures are age-appropriate without acute abnormality.  CONCLUSION:  No evidence of active pulmonary disease.      XR Chest Pa And Lateral [[id number]]  Resulted: 03/09/24 1527, Result status: In process   Resulted by: Antionette FORBES [place], PA-C Performed: 03/09/24 1527 - 03/09/24 1533  Accession number: [id number]       *Some images could not be shown.

## 2024-05-06 ENCOUNTER — Encounter (HOSPITAL_COMMUNITY): Payer: Self-pay

## 2024-05-06 ENCOUNTER — Observation Stay (HOSPITAL_COMMUNITY)
Admission: EM | Admit: 2024-05-06 | Discharge: 2024-05-08 | Disposition: A | Discharge status: Home / Self Care | Source: Ambulatory Visit | Attending: General Surgery | Admitting: General Surgery

## 2024-05-06 ENCOUNTER — Emergency Department (HOSPITAL_COMMUNITY)

## 2024-05-06 ENCOUNTER — Other Ambulatory Visit: Payer: Self-pay

## 2024-05-06 DIAGNOSIS — R112 Nausea with vomiting, unspecified: Secondary | ICD-10-CM | POA: Insufficient documentation

## 2024-05-06 DIAGNOSIS — K810 Acute cholecystitis: Principal | ICD-10-CM | POA: Insufficient documentation

## 2024-05-06 DIAGNOSIS — R109 Unspecified abdominal pain: Secondary | ICD-10-CM | POA: Diagnosis present

## 2024-05-06 DIAGNOSIS — K2900 Acute gastritis without bleeding: Secondary | ICD-10-CM | POA: Insufficient documentation

## 2024-05-06 DIAGNOSIS — K819 Cholecystitis, unspecified: Secondary | ICD-10-CM | POA: Diagnosis present

## 2024-05-06 LAB — COMPREHENSIVE METABOLIC PANEL WITH GFR
ALT: 12 U/L (ref 0–44)
AST: 23 U/L (ref 15–41)
Albumin: 4.2 g/dL (ref 3.5–5.0)
Alkaline Phosphatase: 84 U/L (ref 38–126)
Anion gap: 16 — ABNORMAL HIGH (ref 5–15)
BUN: 15 mg/dL (ref 6–20)
CO2: 24 mmol/L (ref 22–32)
Calcium: 9.9 mg/dL (ref 8.9–10.3)
Chloride: 97 mmol/L — ABNORMAL LOW (ref 98–111)
Creatinine, Ser: 0.66 mg/dL (ref 0.44–1.00)
GFR, Estimated: >60 mL/min (ref >60)
Glucose, Bld: 157 mg/dL — ABNORMAL HIGH (ref 70–99)
Potassium: 3.6 mmol/L (ref 3.5–5.1)
Sodium: 137 mmol/L (ref 135–145)
Total Bilirubin: 0.5 mg/dL (ref 0.0–1.2)
Total Protein: 8.3 g/dL — ABNORMAL HIGH (ref 6.5–8.1)

## 2024-05-06 LAB — CBC
HCT: 40.8 % (ref 36.0–46.0)
Hemoglobin: 13.3 g/dL (ref 12.0–15.0)
MCH: 26.9 pg (ref 26.0–34.0)
MCHC: 32.6 g/dL (ref 30.0–36.0)
MCV: 82.6 fL (ref 80.0–100.0)
Platelets: 350 K/uL (ref 150–400)
RBC: 4.94 MIL/uL (ref 3.87–5.11)
RDW: 14.6 % (ref 11.5–15.5)
WBC: 12.4 K/uL — ABNORMAL HIGH (ref 4.0–10.5)
nRBC: 0 % (ref 0.0–0.2)

## 2024-05-06 LAB — HIV ANTIBODY (ROUTINE TESTING W REFLEX): HIV Screen 4th Generation wRfx: NONREACTIVE

## 2024-05-06 LAB — RESP PANEL BY RT-PCR (RSV, FLU A&B, COVID)  RVPGX2
Influenza A by PCR: NEGATIVE
Influenza B by PCR: NEGATIVE
Resp Syncytial Virus by PCR: NEGATIVE
SARS Coronavirus 2 by RT PCR: NEGATIVE

## 2024-05-06 LAB — LIPASE, BLOOD: Lipase: 46 U/L (ref 11–51)

## 2024-05-06 MED ORDER — SODIUM CHLORIDE 0.9 % IV SOLN
2.0000 g | INTRAVENOUS | Status: DC
  Administered 2024-05-06: 2 g via INTRAVENOUS
  Filled 2024-05-06: qty 20

## 2024-05-06 MED ORDER — HYDROMORPHONE HCL 1 MG/ML IJ SOLN
1.0000 mg | INTRAMUSCULAR | Status: DC | PRN
  Administered 2024-05-07 – 2024-05-08 (×6): 1 mg via INTRAVENOUS
  Filled 2024-05-06 (×6): qty 1

## 2024-05-06 MED ORDER — METHOCARBAMOL 1000 MG/10ML IJ SOLN: 500.0000 mg | Freq: Three times a day (TID) | INTRAMUSCULAR | Status: DC | PRN

## 2024-05-06 MED ORDER — ACETAMINOPHEN 500 MG PO TABS
1000.0000 mg | ORAL_TABLET | Freq: Four times a day (QID) | ORAL | Status: DC
  Administered 2024-05-06 – 2024-05-08 (×8): 1000 mg via ORAL
  Filled 2024-05-06 (×8): qty 2

## 2024-05-06 MED ORDER — KETOROLAC TROMETHAMINE 30 MG/ML IJ SOLN
30.0000 mg | Freq: Once | INTRAMUSCULAR | Status: AC
  Administered 2024-05-06: 30 mg via INTRAVENOUS
  Filled 2024-05-06: qty 1

## 2024-05-06 MED ORDER — VENLAFAXINE HCL ER 37.5 MG PO CP24
75.0000 mg | ORAL_CAPSULE | Freq: Every day | ORAL | Status: DC
  Administered 2024-05-07 – 2024-05-08 (×2): 75 mg via ORAL
  Filled 2024-05-06 (×2): qty 2

## 2024-05-06 MED ORDER — DOCUSATE SODIUM 100 MG PO CAPS
100.0000 mg | ORAL_CAPSULE | Freq: Two times a day (BID) | ORAL | Status: DC
  Administered 2024-05-06 – 2024-05-08 (×4): 100 mg via ORAL
  Filled 2024-05-06 (×4): qty 1

## 2024-05-06 MED ORDER — DIPHENHYDRAMINE HCL 25 MG PO CAPS
25.0000 mg | ORAL_CAPSULE | Freq: Four times a day (QID) | ORAL | Status: DC | PRN
Start: 2024-05-06 — End: 2024-05-08

## 2024-05-06 MED ORDER — ONDANSETRON HCL 4 MG/2ML IJ SOLN
4.0000 mg | Freq: Once | INTRAMUSCULAR | Status: AC
  Administered 2024-05-06: 4 mg via INTRAVENOUS
  Filled 2024-05-06: qty 2

## 2024-05-06 MED ORDER — DIPHENHYDRAMINE HCL 50 MG/ML IJ SOLN: 25.0000 mg | Freq: Four times a day (QID) | INTRAMUSCULAR | Status: DC | PRN

## 2024-05-06 MED ORDER — PROCHLORPERAZINE EDISYLATE 10 MG/2ML IJ SOLN
5.0000 mg | Freq: Four times a day (QID) | INTRAMUSCULAR | Status: DC | PRN
  Administered 2024-05-06: 10 mg via INTRAVENOUS
  Filled 2024-05-06: qty 2

## 2024-05-06 MED ORDER — METOPROLOL TARTRATE 5 MG/5ML IV SOLN: 5.0000 mg | Freq: Four times a day (QID) | INTRAVENOUS | Status: DC | PRN

## 2024-05-06 MED ORDER — PROCHLORPERAZINE MALEATE 10 MG PO TABS
10.0000 mg | ORAL_TABLET | Freq: Four times a day (QID) | ORAL | Status: DC | PRN
Start: 2024-05-06 — End: 2024-05-08

## 2024-05-06 MED ORDER — ENOXAPARIN SODIUM 40 MG/0.4ML IJ SOSY
40.0000 mg | PREFILLED_SYRINGE | INTRAMUSCULAR | Status: DC
  Administered 2024-05-07: 40 mg via SUBCUTANEOUS
  Filled 2024-05-06: qty 0.4

## 2024-05-06 MED ORDER — OXYCODONE HCL 5 MG PO TABS
5.0000 mg | ORAL_TABLET | ORAL | Status: DC | PRN
  Administered 2024-05-06 (×2): 5 mg via ORAL
  Administered 2024-05-07: 10 mg via ORAL
  Administered 2024-05-07: 5 mg via ORAL
  Administered 2024-05-08: 10 mg via ORAL
  Filled 2024-05-06: qty 2
  Filled 2024-05-06: qty 1
  Filled 2024-05-06: qty 2
  Filled 2024-05-06 (×2): qty 1

## 2024-05-06 MED ORDER — SODIUM CHLORIDE 0.9 % IV SOLN
INTRAVENOUS | Status: AC
  Administered 2024-05-06: 75 mL/h via INTRAVENOUS

## 2024-05-06 MED ORDER — SODIUM CHLORIDE 0.9 % IV BOLUS
1000.0000 mL | Freq: Once | INTRAVENOUS | Status: AC
  Administered 2024-05-06: 1000 mL via INTRAVENOUS

## 2024-05-06 MED ORDER — FENTANYL CITRATE PF 50 MCG/ML IJ SOSY
50.0000 ug | PREFILLED_SYRINGE | Freq: Once | INTRAMUSCULAR | Status: AC
  Administered 2024-05-06: 50 ug via INTRAVENOUS
  Filled 2024-05-06: qty 1

## 2024-05-06 MED ORDER — METHOCARBAMOL 500 MG PO TABS
500.0000 mg | ORAL_TABLET | Freq: Three times a day (TID) | ORAL | Status: DC | PRN
  Administered 2024-05-06 – 2024-05-07 (×2): 500 mg via ORAL
  Filled 2024-05-06 (×3): qty 1

## 2024-05-06 MED ORDER — PANTOPRAZOLE SODIUM 40 MG PO TBEC
40.0000 mg | DELAYED_RELEASE_TABLET | Freq: Every day | ORAL | Status: DC
  Administered 2024-05-06 – 2024-05-08 (×3): 40 mg via ORAL
  Filled 2024-05-06 (×3): qty 1

## 2024-05-06 MED ORDER — IOHEXOL 300 MG/ML  SOLN
100.0000 mL | Freq: Once | INTRAMUSCULAR | Status: AC | PRN
  Administered 2024-05-06: 100 mL via INTRAVENOUS

## 2024-05-06 MED ORDER — ONDANSETRON 4 MG PO TBDP: 4.0000 mg | ORAL_TABLET | Freq: Four times a day (QID) | ORAL | Status: DC | PRN

## 2024-05-06 MED ORDER — POLYETHYLENE GLYCOL 3350 17 G PO PACK
17.0000 g | PACK | Freq: Every day | ORAL | Status: DC | PRN
Start: 2024-05-06 — End: 2024-05-08

## 2024-05-06 MED ORDER — MIRABEGRON ER 25 MG PO TB24
50.0000 mg | ORAL_TABLET | Freq: Every day | ORAL | Status: DC
  Administered 2024-05-06 – 2024-05-08 (×2): 50 mg via ORAL
  Filled 2024-05-06 (×3): qty 2

## 2024-05-06 MED ORDER — ONDANSETRON HCL 4 MG/2ML IJ SOLN
4.0000 mg | Freq: Four times a day (QID) | INTRAMUSCULAR | Status: DC | PRN
  Administered 2024-05-06 – 2024-05-07 (×2): 4 mg via INTRAVENOUS
  Filled 2024-05-06 (×2): qty 2

## 2024-05-06 NOTE — Consult Note (Signed)
 Consult/Admission Note  [name], JD 1971/02/11  [id number].    Requesting MD:  Alm Sherwin Cave, MD  Chief Complaint/Reason for Consult:  Abdominal pain with nausea, vomiting, and chills  HPI:  [name] is a 53 year female with a PMH of pulmonary nodules, HLD, anxiety, chronic low back pain, lumbar herniated disc, frequency/urgency of urination, GERD, and hemorrhoids that presented to ED due to abdominal pain, nausea, vomiting, and chills. Patient states that she ate leftover seafood from [employer] 05/02/2024 and began to experience epigastric discomfort that night that has since intensified. Patient has vomited numerous times since 7/18. She experiences more nausea and vomiting with certain movements and with eating. She last ate yesterday but vomited shortly after intake. She has been able to tolerate liquids without vomiting. Patient had 3 bowel movements 7/19 that she describes as diarrhea. She states that to her knowledge no one else that she has come in contact with has been ill. In the last 4 days, she has added advil  to her regimen due to her stomach pain. She was taking 6 per day. She reports no past history of gastric ulcers.  Patient is a former smoker. She stopped smoking 10-12 years ago. Her surgical history includes tonsillectomy and adenoidectomy, hysterectomy, lumbar laminectomy/decompression microdiscectomy, hemorrhoidectomy, colonoscopy, bunionectomy, and breast enhancement surgery. She denies recreational drug use. She reports allergies to codeine and succinylcholine.   ROS: All negative with the exception of above  Family History  Problem Relation Age of Onset   Lung cancer Mother        smoked   Allergies Mother    Cancer Mother    Diabetes Mother    Hyperlipidemia Mother    Hyperlipidemia Father    Skin cancer Father    Cancer Maternal Grandmother    Heart disease Maternal Grandfather    Cancer Paternal Grandmother    Cancer  Paternal Grandfather    Heart disease Paternal Grandfather    Lung cancer Maternal Aunt        smoked   Lung cancer Maternal Uncle        never smoker   Colon cancer Maternal Uncle    Breast cancer Paternal Aunt    Colon cancer Cousin    Colon cancer Cousin    Esophageal cancer Neg Hx    Stomach cancer Neg Hx    Rectal cancer Neg Hx     Past Medical History:  Diagnosis Date   Anxiety    Chronic low back pain    Complication of anesthesia    hard to wake due to succinycholine  ( per pt does not have enyzme that breaks this done)   Frequency of urination    GERD (gastroesophageal reflux disease)    Headache    Hemorrhoids    internal and external   History of pelvic inflammatory disease    Hyperlipidemia    Lumbar herniated disc    per pt L4--5   Overactive bladder    urologist--- dr winter   Prediabetes    Pulmonary nodules    stable since 2015, follows with  [facility]   Seasonal allergies    Urgency of urination     Past Surgical History:  Procedure Laterality Date   BREAST ENHANCEMENT SURGERY Bilateral 2002   BUNIONECTOMY Left 2019   CERVICAL CONIZATION W/BX  1990   dr ray   COLONOSCOPY  12/29/2019   dr wilhelmenia   EXPLORATORY LAPAROTOMY  1995  by dr ray;   right ovarian cystectomy   HEMORRHOID BANDING     x 3   HEMORRHOID SURGERY N/A 12/04/2022   Procedure: HEMORRHOIDECTOMY ANTERIOR MIDLINE;  Surgeon: Teresa Lonni HERO, MD;  Location: [facility];  Service: General;  Laterality: N/A;   LAPAROTOMY  07/01/2003   @[facility]  by dr lomax;  started w/ laparoscopy turned laparotomy due to extensive adhesions ;  left ovarian cystectomy (adnexal mass),  extensive lysis adhesions left salpingo and left ovary repair   LUMBAR LAMINECTOMY/DECOMPRESSION MICRODISCECTOMY  06/13/2002   @[facility]  by dr unice;  bilateral lam L5--S1 and left L5--S1 disectomy   RECTAL EXAM UNDER ANESTHESIA N/A 12/04/2022   Procedure: ANORECTAL EXAM UNDER ANESTHESIA;  Surgeon:  Teresa Lonni HERO, MD;  Location: [facility];  Service: General;  Laterality: N/A;   SUPRACERVICAL ABDOMINAL HYSTERECTOMY  08/27/2007   @ [facility] by dr lomax;   w/ BILATERAL SALPINGOOPHORECTOMY AND REVISION MULTIPLE ABDOMINAL SCARS   TONSILLECTOMY AND ADENOIDECTOMY     child    Social History:  reports that she quit smoking about 10 years ago. Her smoking use included cigarettes. She started smoking about 20 years ago. She has a 2.5 pack-year smoking history. She has never used smokeless tobacco. She reports that she does not drink alcohol and does not use drugs.  Allergies:  Allergies  Allergen Reactions   Codeine Swelling    Hard to swallow    Succinylcholine     Hard to wake up from surgery     (Not in a hospital admission)   Blood pressure (!) 158/71, pulse 74, temperature 98.7 F (37.1 C), temperature source Oral, resp. rate 16, height 5' 1 (1.549 m), weight 68 kg, SpO2 98%. Physical Exam:  General: Pleasant, WD, female who is laying in bed in NAD. HEENT: Sclera are noninjected and anicteric.  Heart: regular, rate, and rhythm.  Lungs: Respiratory effort nonlabored Abd: Soft and nondistended. Generalized abdominal tenderness to palpation that is more prominent in the epigastric and LUQ. No rebound tenderness or guarding.  Psych: A&Ox3 with an appropriate affect.   Results for orders placed or performed during the hospital encounter of 05/06/24 (from the past 48 hours)  Lipase, blood     Status: None   Collection Time: 05/06/24 10:53 AM  Result Value Ref Range   Lipase 46 11 - 51 U/L    Comment: Performed at [facility], [address] [place], [place] [address]  Comprehensive metabolic panel     Status: Abnormal   Collection Time: 05/06/24 10:53 AM  Result Value Ref Range   Sodium 137 135 - 145 mmol/L   Potassium 3.6 3.5 - 5.1 mmol/L   Chloride 97 (L) 98 - 111 mmol/L   CO2 24 22 - 32 mmol/L   Glucose, Bld 157 (H) 70 - 99 mg/dL     Comment: Glucose reference range applies only to samples taken after fasting for at least 8 hours.   BUN 15 6 - 20 mg/dL   Creatinine, Ser 9.33 0.44 - 1.00 mg/dL   Calcium  9.9 8.9 - 10.3 mg/dL   Total Protein 8.3 (H) 6.5 - 8.1 g/dL   Albumin 4.2 3.5 - 5.0 g/dL   AST 23 15 - 41 U/L   ALT 12 0 - 44 U/L   Alkaline Phosphatase 84 38 - 126 U/L   Total Bilirubin 0.5 0.0 - 1.2 mg/dL   GFR, Estimated >39 >39 mL/min    Comment: (NOTE) Calculated using the  CKD-EPI Creatinine Equation (2021)    Anion gap 16 (H) 5 - 15    Comment: Performed at [facility], [address] [place], [place] [address]  CBC     Status: Abnormal   Collection Time: 05/06/24 10:53 AM  Result Value Ref Range   WBC 12.4 (H) 4.0 - 10.5 K/uL   RBC 4.94 3.87 - 5.11 MIL/uL   Hemoglobin 13.3 12.0 - 15.0 g/dL   HCT 59.1 63.9 - 53.9 %   MCV 82.6 80.0 - 100.0 fL   MCH 26.9 26.0 - 34.0 pg   MCHC 32.6 30.0 - 36.0 g/dL   RDW 85.3 88.4 - 84.4 %   Platelets 350 150 - 400 K/uL   nRBC 0.0 0.0 - 0.2 %    Comment: Performed at [facility], [address] [place], [place] [address]  Resp panel by RT-PCR (RSV, Flu A&B, Covid) Anterior Nasal Swab     Status: None   Collection Time: 05/06/24 10:53 AM   Specimen: Anterior Nasal Swab  Result Value Ref Range   SARS Coronavirus 2 by RT PCR NEGATIVE NEGATIVE    Comment: (NOTE) SARS-CoV-2 target nucleic acids are NOT DETECTED.  The SARS-CoV-2 RNA is generally detectable in upper respiratory specimens during the acute phase of infection. The lowest concentration of SARS-CoV-2 viral copies this assay can detect is 138 copies/mL. A negative result does not preclude SARS-Cov-2 infection and should not be used as the sole basis for treatment or other patient management decisions. A negative result may occur with  improper specimen collection/handling, submission of specimen other than nasopharyngeal swab, presence of viral mutation(s) within  the areas targeted by this assay, and inadequate number of viral copies(<138 copies/mL). A negative result must be combined with clinical observations, patient history, and epidemiological information. The expected result is Negative.  Fact Sheet for Patients:  BloggerCourse.com  Fact Sheet for Healthcare Providers:  SeriousBroker.it  This test is no t yet approved or cleared by the [place]  FDA and  has been authorized for detection and/or diagnosis of SARS-CoV-2 by FDA under an Emergency Use Authorization (EUA). This EUA will remain  in effect (meaning this test can be used) for the duration of the COVID-19 declaration under Section 564(b)(1) of the Act, 21 U.S.C.section 360bbb-3(b)(1), unless the authorization is terminated  or revoked sooner.       Influenza A by PCR NEGATIVE NEGATIVE   Influenza B by PCR NEGATIVE NEGATIVE    Comment: (NOTE) The Xpert Xpress SARS-CoV-2/FLU/RSV plus assay is intended as an aid in the diagnosis of influenza from Nasopharyngeal swab specimens and should not be used as a sole basis for treatment. Nasal washings and aspirates are unacceptable for Xpert Xpress SARS-CoV-2/FLU/RSV testing.  Fact Sheet for Patients: BloggerCourse.com  Fact Sheet for Healthcare Providers: SeriousBroker.it  This test is not yet approved or cleared by the [place]  FDA and has been authorized for detection and/or diagnosis of SARS-CoV-2 by FDA under an Emergency Use Authorization (EUA). This EUA will remain in effect (meaning this test can be used) for the duration of the COVID-19 declaration under Section 564(b)(1) of the Act, 21 U.S.C. section 360bbb-3(b)(1), unless the authorization is terminated or revoked.     Resp Syncytial Virus by PCR NEGATIVE NEGATIVE    Comment: (NOTE) Fact Sheet for Patients: BloggerCourse.com  Fact  Sheet for Healthcare Providers: SeriousBroker.it  This test is not yet approved or cleared by the [place]  FDA and has been authorized for  detection and/or diagnosis of SARS-CoV-2 by FDA under an Emergency Use Authorization (EUA). This EUA will remain in effect (meaning this test can be used) for the duration of the COVID-19 declaration under Section 564(b)(1) of the Act, 21 U.S.C. section 360bbb-3(b)(1), unless the authorization is terminated or revoked.  Performed at [facility], [address], [place], [place] [address]    CT ABDOMEN PELVIS W CONTRAST Result Date: 05/06/2024 CLINICAL DATA:  Provided history: Abdominal pain, acute, nonlocalized Bowel obstruction suspected EXAM: CT ABDOMEN AND PELVIS WITH CONTRAST TECHNIQUE: Multidetector CT imaging of the abdomen and pelvis was performed using the standard protocol following bolus administration of intravenous contrast. RADIATION DOSE REDUCTION: This exam was performed according to the departmental dose-optimization program which includes automated exposure control, adjustment of the mA and/or kV according to patient size and/or use of iterative reconstruction technique. CONTRAST:  OMNIPAQUE  IOHEXOL  300 MG/ML  SOLN COMPARISON:  CT 10/23/2023, additional priors FINDINGS: Lower chest: Stable nodular area in the periphery of the right lower lobe series 5, image 24 measuring 11 mm, demonstrating long-term stability, favoring scarring. No pleural effusion. Hepatobiliary: Borderline hepatic steatosis. There is gallbladder wall thickening and pericholecystic fat stranding. No abnormal gallbladder distension. No calcified gallstone. Common bile duct is mildly dilated at 8 mm, previously 7 mm. Pancreas: No ductal dilatation or inflammation. Spleen: Normal in size without focal abnormality. Splenule at the hilum. Adrenals/Urinary Tract: No adrenal nodule. No hydronephrosis or perinephric edema.  Homogeneous renal enhancement with symmetric excretion on delayed phase imaging. Urinary bladder is partially distended without wall thickening. Stomach/Bowel: Minimal hiatal hernia. Mild pre pyloric gastric wall thickening, series 2, image 33 no small bowel obstruction or inflammation. Normal appendix. Small to moderate volume of stool in the colon. No colonic inflammation. Vascular/Lymphatic: No acute vascular findings. Normal caliber abdominal aorta. Portal vein is patent. No abdominopelvic adenopathy. Reproductive: Tubular cystic density in the right adnexa, representative measurement 5.5 cm in length series 2, image 76, is unchanged from 2015 exam, considered benign. No specific imaging follow-up is needed. Hysterectomy. Other: No free air or free fluid.  No abdominal wall hernia. Musculoskeletal: There are no acute or suspicious osseous abnormalities. L5-S1 degenerative disc disease. IMPRESSION: 1. Gallbladder wall thickening and pericholecystic fat stranding, suspicious for acute cholecystitis. No calcified gallstone. Recommend correlation with right upper quadrant ultrasound. 2. Mild common bile duct dilatation at 8 mm, previously 7 mm. Recommend correlation with LFTs. 3. Mild pre pyloric gastric wall thickening, can be seen with gastritis. 4. Borderline hepatic steatosis. Electronically Signed   By: Andrea Gasman M.D.   On: 05/06/2024 14:50      Assessment/Plan Abdominal pain with nausea, vomiting, and chills Cholelithiasis  - CT AP w/ Contrast from 7/22 showed gallbladder wall thickening and pericholecystic fat stranding. No abnormal gallbladder distension. No calcified gallstone. Common bile duct is mildly dilated at 8 mm, previously 7 mm. Minimal hiatal hernia. Mild pre pyloric gastric wall thickening, no small bowel obstruction or inflammation. Normal appendix. Small to moderate volume of stool in the colon. No colonic inflammation. - RUQ U/S from 7/22 showed gallbladder wall is mildly  thickened measuring 4 mm. No pericholecystic fluid. Negative sonographic Murphy's sign reported. Common bile duct 6 mm. No sonographic features of acute cholecystitis. - Lipase 46 - LFTs within normal limits - WBC 12.4 - Covid/Influenza/RSV negative - Clinical picture concerning for cholecystitis vs gastroenteritis. Will admit for observation. Possible cholecystectomy.    Admit to general surgery.   FEN: NPO, Sodium chloride  0.9%  at 75 mL/hr VTE: None ID: Rocephin     I reviewed nursing notes, ED provider notes, last 24 h vitals and pain scores, last 48 h intake and output, last 24 h labs and trends, and last 24 h imaging results.  This care required high  level of medical decision making.   Marjorie Carlyon Favre, [facility] 05/06/2024, 3:31 PM Please see Amion for pager number during day hours 7:00am-4:30pm

## 2024-05-06 NOTE — ED Triage Notes (Signed)
 Pt arriving via POV from home for N/V, chills, cold sweats, fever, generalized fatigue. Pt sent by [facility] for eval. Vomited x 1 today, mostly dry heaving. Pt reports eating leftover [employer] and believes she may have food poisoning.

## 2024-05-06 NOTE — ED Provider Notes (Signed)
 [place] [facility] AT [facility] Provider Note   CSN: [id number] Arrival date & time: 05/06/24  1014     Patient presents with: Abdominal Pain, Nausea, and Chills   Alyx P Bourbon, JD is a 53 y.o. female history of lumbar stenosis here presenting with abdominal pain and nausea and chills.  Patient states that 3 days ago, she ate some leftover shrimp and pasta from [employer].  She states that 2 days ago she had some chills and nausea.  She then started vomiting.  She has diffuse abdominal cramps as well.  Patient states that she had a previous hysterectomy.  Denies any other sick contacts or travel.   The history is provided by the patient.       Prior to Admission medications   Medication Sig Start Date End Date Taking? Authorizing Provider  Cholecalciferol (VITAMIN D-3) 5000 UNITS TABS Take 1 tablet by mouth 2 (two) times daily.    [provider]  estradiol (VIVELLE-DOT) 0.075 MG/24HR Place 1 patch onto the skin 2 (two) times a week.    [provider]  Evolocumab (REPATHA) 140 MG/ML SOSY Inject into the skin as directed. Every other Friday    [provider]  Loratadine-Pseudoephedrine (CLARITIN-D 24 HOUR PO) Take 1 tablet by mouth daily.    [provider]  mirabegron  ER (MYRBETRIQ ) 50 MG TB24 tablet Take 50 mg by mouth daily.    [provider]  oxybutynin (DITROPAN-XL) 10 MG 24 hr tablet Take 10 mg by mouth at bedtime.    [provider]  pantoprazole  (PROTONIX ) 40 MG tablet Take 40 mg by mouth daily.    [provider]  venlafaxine  XR (EFFEXOR -XR) 75 MG 24 hr capsule Take 75 mg by mouth daily with breakfast.    [provider]    Allergies: Codeine and Succinylcholine    Review of Systems  Gastrointestinal:  Positive for abdominal pain and vomiting.  All other systems reviewed and are negative.   Updated Vital Signs BP (!) 144/80 (BP Location: Right Arm)   Pulse 70    Temp 98.7 F (37.1 C) (Oral)   Resp 16   Ht 5' 1 (1.549 m)   Wt 68 kg   SpO2 99%   BMI 28.34 kg/m   Physical Exam Vitals and nursing note reviewed.  Constitutional:      Comments: Slightly uncomfortable and vomiting  HENT:     Head: Normocephalic.     Mouth/Throat:     Pharynx: Oropharynx is clear.  Eyes:     Extraocular Movements: Extraocular movements intact.  Cardiovascular:     Rate and Rhythm: Normal rate and regular rhythm.  Pulmonary:     Effort: Pulmonary effort is normal.     Breath sounds: Normal breath sounds.  Abdominal:     Comments: Mild diffuse tenderness but no rebound  Skin:    General: Skin is warm.     Capillary Refill: Capillary refill takes less than 2 seconds.  Neurological:     General: No focal deficit present.     Mental Status: She is oriented to person, place, and time.  Psychiatric:        Mood and Affect: Mood normal.        Behavior: Behavior normal.     (all labs ordered are listed, but only abnormal results are displayed) Labs Reviewed  COMPREHENSIVE METABOLIC PANEL WITH GFR - Abnormal; Notable for the following components:      Result Value  Chloride 97 (*)    Glucose, Bld 157 (*)    Total Protein 8.3 (*)    Anion gap 16 (*)    All other components within normal limits  CBC - Abnormal; Notable for the following components:   WBC 12.4 (*)    All other components within normal limits  RESP PANEL BY RT-PCR (RSV, FLU A&B, COVID)  [id number]  LIPASE, BLOOD  URINALYSIS, ROUTINE W REFLEX MICROSCOPIC    EKG: None  Radiology: No results found.   Procedures   Medications Ordered in the ED  sodium chloride  0.9 % bolus 1,000 mL (has no administration in time range)  ondansetron  (ZOFRAN ) injection 4 mg (has no administration in time range)  ketorolac  (TORADOL ) 30 MG/ML injection 30 mg (has no administration in time range)                                    Medical Decision Making Eadie P Laning, JD is a 53 y.o. female  here presenting with vomiting and abdominal cramps.  I think likely gastroenteritis versus food poisoning versus small bowel obstruction.  Plan to get CBC CMP and CT abdomen pelvis.  2:58 PM I reviewed patient's labs and white blood cell count is 12.  LFTs are normal.  CT abdomen pelvis showed acute cholecystitis.  I reexamined the patient and she has now more focal epigastric and right upper quadrant tenderness.  Discussed with Dr. Stevie from surgery who will come and evaluate the patient  Problems Addressed: Acute cholecystitis: acute illness or injury  Amount and/or Complexity of Data Reviewed Labs: ordered. Decision-making details documented in ED Course. Radiology: ordered and independent interpretation performed. Decision-making details documented in ED Course.  Risk Prescription drug management.     Final diagnoses:  None    ED Discharge Orders     None          Patt Alm Macho, MD 05/06/24 1500

## 2024-05-06 NOTE — Anesthesia Preprocedure Evaluation (Signed)
 Anesthesia Evaluation  Patient identified by MRN, date of birth, ID band Patient awake    Reviewed: Allergy & Precautions, NPO status , Patient's Chart, lab work & pertinent test results  Airway Mallampati: III  TM Distance: >3 FB Neck ROM: Full    Dental  (+) Chipped, Dental Advisory Given,    Pulmonary former smoker   Pulmonary exam normal breath sounds clear to auscultation       Cardiovascular negative cardio ROS Normal cardiovascular exam Rhythm:Regular Rate:Normal     Neuro/Psych  Headaches PSYCHIATRIC DISORDERS Anxiety        GI/Hepatic Neg liver ROS,GERD  ,,  Endo/Other  negative endocrine ROS    Renal/GU negative Renal ROS  negative genitourinary   Musculoskeletal negative musculoskeletal ROS (+)    Abdominal   Peds  Hematology negative hematology ROS (+)   Anesthesia Other Findings Pseudocholinesterase def?  Reproductive/Obstetrics                              Anesthesia Physical Anesthesia Plan  ASA: 2  Anesthesia Plan: General   Post-op Pain Management: Tylenol  PO (pre-op)*   Induction: Intravenous  PONV Risk Score and Plan: 3 and Midazolam , Dexamethasone  and Ondansetron   Airway Management Planned: Oral ETT  Additional Equipment:   Intra-op Plan:   Post-operative Plan: Extubation in OR  Informed Consent: I have reviewed the patients History and Physical, chart, labs and discussed the procedure including the risks, benefits and alternatives for the proposed anesthesia with the patient or authorized representative who has indicated his/her understanding and acceptance.     Dental advisory given  Plan Discussed with: CRNA  Anesthesia Plan Comments:          Anesthesia Quick Evaluation

## 2024-05-07 ENCOUNTER — Observation Stay (HOSPITAL_COMMUNITY): Payer: Self-pay | Admitting: Anesthesiology

## 2024-05-07 ENCOUNTER — Encounter (HOSPITAL_COMMUNITY)
Admission: EM | Disposition: A | Discharge status: Home / Self Care | Payer: Self-pay | Source: Ambulatory Visit | Attending: Emergency Medicine

## 2024-05-07 ENCOUNTER — Observation Stay (HOSPITAL_COMMUNITY)

## 2024-05-07 ENCOUNTER — Observation Stay (HOSPITAL_BASED_OUTPATIENT_CLINIC_OR_DEPARTMENT_OTHER): Payer: Self-pay | Admitting: Anesthesiology

## 2024-05-07 ENCOUNTER — Encounter (HOSPITAL_COMMUNITY): Payer: Self-pay

## 2024-05-07 ENCOUNTER — Other Ambulatory Visit: Payer: Self-pay

## 2024-05-07 DIAGNOSIS — K819 Cholecystitis, unspecified: Secondary | ICD-10-CM

## 2024-05-07 HISTORY — PX: CHOLECYSTECTOMY: SHX55

## 2024-05-07 LAB — CBC
HCT: 33.5 % — ABNORMAL LOW (ref 36.0–46.0)
Hemoglobin: 10.2 g/dL — ABNORMAL LOW (ref 12.0–15.0)
MCH: 26.6 pg (ref 26.0–34.0)
MCHC: 30.4 g/dL (ref 30.0–36.0)
MCV: 87.2 fL (ref 80.0–100.0)
Platelets: 213 K/uL (ref 150–400)
RBC: 3.84 MIL/uL — ABNORMAL LOW (ref 3.87–5.11)
RDW: 14.8 % (ref 11.5–15.5)
WBC: 8 K/uL (ref 4.0–10.5)
nRBC: 0 % (ref 0.0–0.2)

## 2024-05-07 LAB — COMPREHENSIVE METABOLIC PANEL WITH GFR
ALT: 11 U/L (ref 0–44)
AST: 14 U/L — ABNORMAL LOW (ref 15–41)
Albumin: 3.1 g/dL — ABNORMAL LOW (ref 3.5–5.0)
Alkaline Phosphatase: 67 U/L (ref 38–126)
Anion gap: 9 (ref 5–15)
BUN: 11 mg/dL (ref 6–20)
CO2: 23 mmol/L (ref 22–32)
Calcium: 8.2 mg/dL — ABNORMAL LOW (ref 8.9–10.3)
Chloride: 106 mmol/L (ref 98–111)
Creatinine, Ser: 0.56 mg/dL (ref 0.44–1.00)
GFR, Estimated: >60 mL/min (ref >60)
Glucose, Bld: 121 mg/dL — ABNORMAL HIGH (ref 70–99)
Potassium: 3.1 mmol/L — ABNORMAL LOW (ref 3.5–5.1)
Sodium: 138 mmol/L (ref 135–145)
Total Bilirubin: 0.5 mg/dL (ref 0.0–1.2)
Total Protein: 6.2 g/dL — ABNORMAL LOW (ref 6.5–8.1)

## 2024-05-07 SURGERY — LAPAROSCOPIC CHOLECYSTECTOMY WITH INTRAOPERATIVE CHOLANGIOGRAM
Anesthesia: General

## 2024-05-07 MED ORDER — BUPIVACAINE-EPINEPHRINE 0.25% -1:200000 IJ SOLN
INTRAMUSCULAR | Status: DC | PRN
Start: 2024-05-07 — End: 2024-05-07
  Administered 2024-05-07: 30 mL

## 2024-05-07 MED ORDER — LORAZEPAM 2 MG/ML IJ SOLN: 0.5000 mg | Freq: Four times a day (QID) | INTRAMUSCULAR | Status: DC | PRN

## 2024-05-07 MED ORDER — LIDOCAINE HCL (PF) 2 % IJ SOLN
INTRAMUSCULAR | Status: DC | PRN
Start: 2024-05-07 — End: 2024-05-07
  Administered 2024-05-07: 100 mg via INTRADERMAL

## 2024-05-07 MED ORDER — ROCURONIUM BROMIDE 10 MG/ML (PF) SYRINGE
PREFILLED_SYRINGE | INTRAVENOUS | Status: DC | PRN
  Administered 2024-05-07: 50 mg via INTRAVENOUS

## 2024-05-07 MED ORDER — ROCURONIUM BROMIDE 10 MG/ML (PF) SYRINGE
PREFILLED_SYRINGE | INTRAVENOUS | Status: AC
  Filled 2024-05-07: qty 10

## 2024-05-07 MED ORDER — HYDRALAZINE HCL 20 MG/ML IJ SOLN
5.0000 mg | INTRAMUSCULAR | Status: DC | PRN
  Filled 2024-05-07: qty 1

## 2024-05-07 MED ORDER — OXYCODONE HCL 5 MG PO TABS: 5.0000 mg | ORAL_TABLET | Freq: Once | ORAL | Status: DC | PRN

## 2024-05-07 MED ORDER — FENTANYL CITRATE (PF) 100 MCG/2ML IJ SOLN
INTRAMUSCULAR | Status: AC
  Filled 2024-05-07: qty 2

## 2024-05-07 MED ORDER — SUGAMMADEX SODIUM 200 MG/2ML IV SOLN
INTRAVENOUS | Status: AC
  Filled 2024-05-07: qty 2

## 2024-05-07 MED ORDER — BUPIVACAINE-EPINEPHRINE (PF) 0.25% -1:200000 IJ SOLN
INTRAMUSCULAR | Status: AC
  Filled 2024-05-07: qty 30

## 2024-05-07 MED ORDER — CALCIUM CARBONATE ANTACID 500 MG PO CHEW: 1.0000 | CHEWABLE_TABLET | Freq: Three times a day (TID) | ORAL | Status: DC | PRN

## 2024-05-07 MED ORDER — FENTANYL CITRATE PF 50 MCG/ML IJ SOSY
25.0000 ug | PREFILLED_SYRINGE | INTRAMUSCULAR | Status: DC | PRN
  Administered 2024-05-07 (×2): 50 ug via INTRAVENOUS

## 2024-05-07 MED ORDER — SUGAMMADEX SODIUM 200 MG/2ML IV SOLN
INTRAVENOUS | Status: DC | PRN
  Administered 2024-05-07: 200 mg via INTRAVENOUS

## 2024-05-07 MED ORDER — ONDANSETRON HCL 4 MG/2ML IJ SOLN
INTRAMUSCULAR | Status: DC | PRN
  Administered 2024-05-07: 4 mg via INTRAVENOUS

## 2024-05-07 MED ORDER — IOHEXOL 300 MG/ML  SOLN
INTRAMUSCULAR | Status: DC | PRN
  Administered 2024-05-07: 7 mL

## 2024-05-07 MED ORDER — MIDAZOLAM HCL 5 MG/5ML IJ SOLN
INTRAMUSCULAR | Status: DC | PRN
  Administered 2024-05-07: 2 mg via INTRAVENOUS

## 2024-05-07 MED ORDER — PROPOFOL 10 MG/ML IV BOLUS
INTRAVENOUS | Status: AC
  Filled 2024-05-07: qty 20

## 2024-05-07 MED ORDER — PHENYLEPHRINE 80 MCG/ML (10ML) SYRINGE FOR IV PUSH (FOR BLOOD PRESSURE SUPPORT)
PREFILLED_SYRINGE | INTRAVENOUS | Status: DC | PRN
  Administered 2024-05-07 (×2): 160 ug via INTRAVENOUS

## 2024-05-07 MED ORDER — PROPOFOL 10 MG/ML IV BOLUS
INTRAVENOUS | Status: DC | PRN
  Administered 2024-05-07: 150 mg via INTRAVENOUS

## 2024-05-07 MED ORDER — LIDOCAINE HCL (PF) 2 % IJ SOLN
INTRAMUSCULAR | Status: AC
  Filled 2024-05-07: qty 5

## 2024-05-07 MED ORDER — MIDAZOLAM HCL 2 MG/2ML IJ SOLN
INTRAMUSCULAR | Status: AC
  Filled 2024-05-07: qty 2

## 2024-05-07 MED ORDER — IBUPROFEN 400 MG PO TABS
600.0000 mg | ORAL_TABLET | Freq: Four times a day (QID) | ORAL | Status: DC | PRN
  Administered 2024-05-07: 600 mg via ORAL
  Filled 2024-05-07: qty 1

## 2024-05-07 MED ORDER — FENTANYL CITRATE PF 50 MCG/ML IJ SOSY
PREFILLED_SYRINGE | INTRAMUSCULAR | Status: AC
Start: 2024-05-07 — End: 2024-05-07
  Filled 2024-05-07: qty 1

## 2024-05-07 MED ORDER — PHENYLEPHRINE HCL (PRESSORS) 10 MG/ML IV SOLN
INTRAVENOUS | Status: AC
  Filled 2024-05-07: qty 1

## 2024-05-07 MED ORDER — LACTATED RINGERS IV SOLN: INTRAVENOUS | Status: DC

## 2024-05-07 MED ORDER — AMISULPRIDE (ANTIEMETIC) 5 MG/2ML IV SOLN
10.0000 mg | Freq: Once | INTRAVENOUS | Status: AC | PRN
  Administered 2024-05-07: 10 mg via INTRAVENOUS

## 2024-05-07 MED ORDER — ORAL CARE MOUTH RINSE: 15.0000 mL | Freq: Once | OROMUCOSAL | Status: DC

## 2024-05-07 MED ORDER — STERILE WATER FOR IRRIGATION IR SOLN
Status: DC | PRN
Start: 2024-05-07 — End: 2024-05-07
  Administered 2024-05-07: 1000 mL

## 2024-05-07 MED ORDER — LACTATED RINGERS IR SOLN
Status: DC | PRN
Start: 2024-05-07 — End: 2024-05-07
  Administered 2024-05-07: 1000 mL

## 2024-05-07 MED ORDER — FENTANYL CITRATE (PF) 100 MCG/2ML IJ SOLN
INTRAMUSCULAR | Status: DC | PRN
  Administered 2024-05-07: 50 ug via INTRAVENOUS
  Administered 2024-05-07: 25 ug via INTRAVENOUS
  Administered 2024-05-07: 50 ug via INTRAVENOUS
  Administered 2024-05-07: 25 ug via INTRAVENOUS

## 2024-05-07 MED ORDER — DEXAMETHASONE SODIUM PHOSPHATE 10 MG/ML IJ SOLN
INTRAMUSCULAR | Status: DC | PRN
  Administered 2024-05-07: 10 mg via INTRAVENOUS

## 2024-05-07 MED ORDER — DEXAMETHASONE SODIUM PHOSPHATE 10 MG/ML IJ SOLN
INTRAMUSCULAR | Status: AC
  Filled 2024-05-07: qty 1

## 2024-05-07 MED ORDER — 0.9 % SODIUM CHLORIDE (POUR BTL) OPTIME
TOPICAL | Status: DC | PRN
Start: 2024-05-07 — End: 2024-05-07
  Administered 2024-05-07: 1000 mL

## 2024-05-07 MED ORDER — SIMETHICONE 80 MG PO CHEW
80.0000 mg | CHEWABLE_TABLET | Freq: Four times a day (QID) | ORAL | Status: DC | PRN
  Administered 2024-05-07: 80 mg via ORAL
  Filled 2024-05-07: qty 1

## 2024-05-07 MED ORDER — AMISULPRIDE (ANTIEMETIC) 5 MG/2ML IV SOLN
INTRAVENOUS | Status: AC
  Filled 2024-05-07: qty 4

## 2024-05-07 MED ORDER — EPHEDRINE SULFATE-NACL 50-0.9 MG/10ML-% IV SOSY
PREFILLED_SYRINGE | INTRAVENOUS | Status: DC | PRN
  Administered 2024-05-07: 10 mg via INTRAVENOUS

## 2024-05-07 MED ORDER — INDOCYANINE GREEN 25 MG IV SOLR
2.5000 mg | Freq: Once | INTRAVENOUS | Status: AC
  Administered 2024-05-07: 2.5 mg via TOPICAL
  Filled 2024-05-07: qty 10

## 2024-05-07 MED ORDER — PHENYLEPHRINE 80 MCG/ML (10ML) SYRINGE FOR IV PUSH (FOR BLOOD PRESSURE SUPPORT)
PREFILLED_SYRINGE | INTRAVENOUS | Status: AC
  Filled 2024-05-07: qty 10

## 2024-05-07 MED ORDER — ONDANSETRON HCL 4 MG/2ML IJ SOLN
INTRAMUSCULAR | Status: AC
  Filled 2024-05-07: qty 2

## 2024-05-07 MED ORDER — OXYCODONE HCL 5 MG/5ML PO SOLN: 5.0000 mg | Freq: Once | ORAL | Status: DC | PRN

## 2024-05-07 MED ORDER — CHLORHEXIDINE GLUCONATE 0.12 % MT SOLN: 15.0000 mL | Freq: Once | OROMUCOSAL | Status: DC

## 2024-05-07 MED ORDER — ACETAMINOPHEN 500 MG PO TABS: 1000.0000 mg | ORAL_TABLET | Freq: Once | ORAL | Status: DC

## 2024-05-07 SURGICAL SUPPLY — 41 items
BAG COUNTER SPONGE SURGICOUNT (BAG) IMPLANT
BENZOIN TINCTURE PRP APPL 2/3 (GAUZE/BANDAGES/DRESSINGS) IMPLANT
BNDG ADH 1X3 SHEER STRL LF (GAUZE/BANDAGES/DRESSINGS) IMPLANT
CABLE HIGH FREQUENCY MONO STRZ (ELECTRODE) ×1 IMPLANT
CHLORAPREP W/TINT 26 (MISCELLANEOUS) ×1 IMPLANT
CLIP APPLIE 5 13 M/L LIGAMAX5 (MISCELLANEOUS) IMPLANT
CLIP APPLIE ROT 10 11.4 M/L (STAPLE) IMPLANT
CLIP LIGATING HEM O LOK PURPLE (MISCELLANEOUS) IMPLANT
CLIP LIGATING HEMO O LOK GREEN (MISCELLANEOUS) ×1 IMPLANT
COVER MAYO STAND XLG (MISCELLANEOUS) ×1 IMPLANT
COVER SURGICAL LIGHT HANDLE (MISCELLANEOUS) ×1 IMPLANT
DRAIN CHANNEL 19F RND (DRAIN) IMPLANT
DRAPE C-ARM 42X120 X-RAY (DRAPES) IMPLANT
ELECT PENCIL ROCKER SW 15FT (MISCELLANEOUS) IMPLANT
ELECT REM PT RETURN 15FT ADLT (MISCELLANEOUS) ×1 IMPLANT
ENDOLOOP SUT PDS II 0 18 (SUTURE) IMPLANT
EVACUATOR SILICONE 100CC (DRAIN) IMPLANT
GLOVE BIOGEL PI IND STRL 7.0 (GLOVE) ×1 IMPLANT
GLOVE SURG SS PI 7.0 STRL IVOR (GLOVE) ×1 IMPLANT
GOWN STRL REUS W/ TWL LRG LVL3 (GOWN DISPOSABLE) ×1 IMPLANT
GRASPER SUT TROCAR 14GX15 (MISCELLANEOUS) IMPLANT
HEMOSTAT SNOW SURGICEL 2X4 (HEMOSTASIS) IMPLANT
IRRIGATION SUCT STRKRFLW 2 WTP (MISCELLANEOUS) ×1 IMPLANT
KIT BASIN OR (CUSTOM PROCEDURE TRAY) ×1 IMPLANT
KIT TURNOVER KIT A (KITS) ×1 IMPLANT
LHOOK LAP DISP 36CM (ELECTROSURGICAL) IMPLANT
NDL HYPO 22X1.5 SAFETY MO (MISCELLANEOUS) ×1 IMPLANT
NEEDLE HYPO 22X1.5 SAFETY MO (MISCELLANEOUS) ×1 IMPLANT
POUCH RETRIEVAL ECOSAC 10 (ENDOMECHANICALS) ×1 IMPLANT
SCISSORS LAP 5X35 DISP (ENDOMECHANICALS) ×1 IMPLANT
SET CHOLANGIOGRAPH MIX (MISCELLANEOUS) IMPLANT
SET TUBE SMOKE EVAC HIGH FLOW (TUBING) ×1 IMPLANT
SLEEVE Z-THREAD 5X100MM (TROCAR) ×2 IMPLANT
SPIKE FLUID TRANSFER (MISCELLANEOUS) ×1 IMPLANT
STRIP CLOSURE SKIN 1/2X4 (GAUZE/BANDAGES/DRESSINGS) IMPLANT
SUT ETHILON 2 0 PS N (SUTURE) IMPLANT
SUT MNCRL AB 4-0 PS2 18 (SUTURE) ×1 IMPLANT
TOWEL OR 17X26 10 PK STRL BLUE (TOWEL DISPOSABLE) ×1 IMPLANT
TRAY LAPAROSCOPIC (CUSTOM PROCEDURE TRAY) ×1 IMPLANT
TROCAR Z THREAD OPTICAL 12X100 (TROCAR) ×1 IMPLANT
TROCAR Z-THREAD OPTICAL 5X100M (TROCAR) ×1 IMPLANT

## 2024-05-07 NOTE — Transfer of Care (Signed)
 Immediate Anesthesia Transfer of Care Note  Patient: [name], JD  Procedure(s) Performed: LAPAROSCOPIC CHOLECYSTECTOMY WITH INTRAOPERATIVE CHOLANGIOGRAM  Patient Location: PACU  Anesthesia Type:General  Level of Consciousness: oriented  Airway & Oxygen Therapy: Patient Spontanous Breathing and Patient connected to face mask oxygen  Post-op Assessment: Report given to RN and Post -op Vital signs reviewed and stable  Post vital signs: Reviewed and stable  Last Vitals:  Vitals Value Taken Time  BP 142/73 05/07/24 11:19  Temp    Pulse 77 05/07/24 11:20  Resp 12 05/07/24 11:20  SpO2 100 % 05/07/24 11:20  Vitals shown include unfiled device data.  Last Pain:  Vitals:   05/07/24 0829  TempSrc:   PainSc: 0-No pain      Patients Stated Pain Goal: 3 (05/07/24 0829)  Complications: No notable events documented.

## 2024-05-07 NOTE — Progress Notes (Signed)
 Pre Procedure note for inpatients:   [name], JD has been scheduled for Procedure(s): LAPAROSCOPIC CHOLECYSTECTOMY WITH INTRAOPERATIVE CHOLANGIOGRAM (N/A) today. The various methods of treatment have been discussed with the patient. After consideration of the risks, benefits and treatment options the patient has consented to the planned procedure.   The patient has been seen and labs reviewed. There are no changes in the patient's condition to prevent proceeding with the planned procedure today.  Recent labs:  Lab Results  Component Value Date   WBC 12.4 (H) 05/06/2024   HGB 13.3 05/06/2024   HCT 40.8 05/06/2024   PLT 350 05/06/2024   GLUCOSE 157 (H) 05/06/2024   ALT 12 05/06/2024   AST 23 05/06/2024   NA 137 05/06/2024   K 3.6 05/06/2024   CL 97 (L) 05/06/2024   CREATININE 0.66 05/06/2024   BUN 15 05/06/2024   CO2 24 05/06/2024   TSH 0.900 05/19/2012   HGBA1C 5.9 05/31/2012    [name], MD 05/07/2024 7:38 AM

## 2024-05-07 NOTE — Op Note (Signed)
 PATIENT:  [name], JD  53 y.o. female  PRE-OPERATIVE DIAGNOSIS:  Cholecystitis  POST-OPERATIVE DIAGNOSIS:  Cholecystitis  PROCEDURE:  Procedure(s): LAPAROSCOPIC CHOLECYSTECTOMY WITH INTRAOPERATIVE CHOLANGIOGRAM   SURGEON:  Deavin Forst, Herlene Righter, MD   ASSISTANT: none  ANESTHESIA:   local and general  Indications for procedure: [name], JD is a 53 y.o. female with symptoms of Abdominal pain and Nausea and vomiting consistent with gallbladder disease, Confirmed by ultrasound and CT.  Description of procedure: The patient was brought into the operative suite, placed supine. Anesthesia was administered with endotracheal tube. Patient was strapped in place and foot board was secured. All pressure points were offloaded by foam padding. The patient was prepped and draped in the usual sterile fashion.  A periumbilical incision was made and optical entry was used to enter the abdomen. 2 5 mm trocars were placed on in the right lateral space on in the right subcostal space. A 12mm trocar was placed in the subxiphoid space. Marcaine  was infused to the subxiphoid space and lateral upper right abdomen in the transversus abdominis plane. Next the patient was placed in reverse trendelenberg. The gallbladder appearedfull of stones, dilated, and acutely inflamed. Omentum was adhered to the gallbladder and was taken down with cautery/blunt dissection.  The gallbladder was retracted cephalad and lateral. The peritoneum was reflected off the infundibulum working lateral to medial. The gallbladder was thick and scarred and there was a large stone in the neck.The cystic duct and cystic artery were identified and further dissection revealed a critical view, due to concern for choledocholithiasis a cholangiogram was performed with ductotomy and cook catheter passed through a separate subcostal stab incision. Normal ductal anatomy was seen with emptying into the duodenum. The cystic duct and cystic  artery were doubly clipped and ligated.   The gallbladder was removed off the liver bed with cautery. The Gallbladder was placed in a specimen bag. The gallbladder fossa was irrigated and hemostasis was applied with cautery. The gallbladder was removed via the 12mm trocar. The fascial defect was closed with interrupted 0 vicryl suture via laparoscopic trans-fascial suture passer. Pneumoperitoneum was removed, all trocar were removed. All incisions were closed with 4-0 monocryl subcuticular stitch. The patient woke from anesthesia and was brought to PACU in stable condition. All counts were correct  Findings: acute calculous cholecystitis, no choledocholithiasis  Specimen: gallbladder  Blood loss: 40 ml  Local anesthesia: 30 ml Marcaine   Complications: none  PLAN OF CARE: Admit to inpatient   PATIENT DISPOSITION:  PACU - hemodynamically stable.  Herlene Righter [facility], [place]

## 2024-05-07 NOTE — Progress Notes (Signed)
   05/07/24 1354  TOC Brief Assessment  Insurance and Status Reviewed  Patient has primary care physician Yes  Home environment has been reviewed home  Prior level of function: independent  Prior/Current Home Services No current home services  Social Drivers of Health Review SDOH reviewed no interventions necessary  Readmission risk has been reviewed Yes  Transition of care needs no transition of care needs at this time

## 2024-05-07 NOTE — Anesthesia Procedure Notes (Signed)
 Procedure Name: Intubation Date/Time: 05/07/2024 9:35 AM  Performed by: Carleton Garnette SAUNDERS, CRNAPre-anesthesia Checklist: Patient identified, Emergency Drugs available, Suction available, Patient being monitored and Timeout performed Patient Re-evaluated:Patient Re-evaluated prior to induction Oxygen Delivery Method: Circle system utilized Preoxygenation: Pre-oxygenation with 100% oxygen Induction Type: IV induction Ventilation: Mask ventilation without difficulty Laryngoscope Size: Mac and 4 Grade View: Grade I Tube type: Oral Tube size: 7.0 mm Number of attempts: 1 Airway Equipment and Method: Stylet Placement Confirmation: ETT inserted through vocal cords under direct vision, positive ETCO2 and breath sounds checked- equal and bilateral Secured at: 21 cm Tube secured with: Tape Dental Injury: Teeth and Oropharynx as per pre-operative assessment

## 2024-05-08 ENCOUNTER — Encounter (HOSPITAL_COMMUNITY): Payer: Self-pay | Admitting: General Surgery

## 2024-05-08 LAB — SURGICAL PATHOLOGY

## 2024-05-08 MED ORDER — PROCHLORPERAZINE MALEATE 10 MG PO TABS: 10.0000 mg | ORAL_TABLET | Freq: Four times a day (QID) | ORAL | 0 refills | Status: AC | PRN

## 2024-05-08 MED ORDER — ACETAMINOPHEN 500 MG PO TABS: 1000.0000 mg | ORAL_TABLET | Freq: Four times a day (QID) | ORAL | 0 refills | Status: AC

## 2024-05-08 MED ORDER — OXYCODONE HCL 5 MG PO TABS: 5.0000 mg | ORAL_TABLET | Freq: Four times a day (QID) | ORAL | 0 refills | Status: AC | PRN

## 2024-05-08 NOTE — Progress Notes (Signed)
 Discharge instructions given to patient and all questions were answered.

## 2024-05-08 NOTE — Discharge Instructions (Signed)

## 2024-05-08 NOTE — Plan of Care (Signed)
   Problem: Education: Goal: Knowledge of General Education information will improve Description: Including pain rating scale, medication(s)/side effects and non-pharmacologic comfort measures Outcome: Progressing   Problem: Activity: Goal: Risk for activity intolerance will decrease Outcome: Progressing

## 2024-05-08 NOTE — Progress Notes (Signed)
 Progress Note  1 Day Post-Op  Subjective: Patient reports continued abdominal soreness, but feels that her pain has improved overall. Patient was experiencing GERD yesterday 7/23, but this has dissipated. Patient has eaten a meal this morning without nausea or vomiting. Patient reports flatulence. Has not had a bowel movement. Reports that her anxiety has improved as well.  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    ROS  All negative with the exception of above.  Objective: Vital signs in last 24 hours: Temp:  [98 F (36.7 C)-100 F (37.8 C)] 98 F (36.7 C) (07/24 0601) Pulse Rate:  [68-88] 68 (07/24 0601) Resp:  [10-18] 18 (07/24 0601) BP: (120-177)/(66-97) 126/68 (07/24 0601) SpO2:  [84 %-100 %] 99 % (07/24 0601) Weight:  [68 kg] 68 kg (07/23 0829) Last BM Date : 05/06/24  Intake/Output from previous day: 07/23 0701 - 07/24 0700 In: 2160 [P.O.:860; I.V.:1200; IV Piggyback:100] Out: 3500 [Urine:3500] Intake/Output this shift: No intake/output data recorded.  PE: General: Pleasant, WD, female who is laying in bed in NAD. Lungs: Respiratory effort nonlabored Abd: Soft and ND. Mild generalized tenderness of abdomen. Incisions C/D/I. No rebounding or guarding. Psych: A&Ox3 with an appropriate affect.    Lab Results:  Recent Labs    05/06/24 1053 05/07/24 1152  WBC 12.4* 8.0  HGB 13.3 10.2*  HCT 40.8 33.5*  PLT 350 213   BMET Recent Labs    05/06/24 1053 05/07/24 0718  NA 137 138  K 3.6 3.1*  CL 97* 106  CO2 24 23  GLUCOSE 157* 121*  BUN 15 11  CREATININE 0.66 0.56  CALCIUM  9.9 8.2*   PT/INR No results for input(s): LABPROT, INR in the last 72 hours. CMP     Component Value Date/Time   NA 138 05/07/2024 0718   K 3.1 (L) 05/07/2024 0718   CL 106 05/07/2024 0718   CO2 23 05/07/2024 0718   GLUCOSE 121 (H) 05/07/2024 0718   BUN 11 05/07/2024 0718   CREATININE 0.56 05/07/2024 0718    CREATININE 0.63 05/19/2012 1656   CALCIUM  8.2 (L) 05/07/2024 0718   PROT 6.2 (L) 05/07/2024 0718   ALBUMIN 3.1 (L) 05/07/2024 0718   AST 14 (L) 05/07/2024 0718   ALT 11 05/07/2024 0718   ALKPHOS 67 05/07/2024 0718   BILITOT 0.5 05/07/2024 0718   GFRNONAA >60 05/07/2024 0718   GFRAA  10/06/2010 1803    >60        The eGFR has been calculated using the MDRD equation. This calculation has not been validated in all clinical situations. eGFR's persistently <60 mL/min signify possible Chronic Kidney Disease.   Lipase     Component Value Date/Time   LIPASE 46 05/06/2024 1053       Studies/Results: DG Cholangiogram Operative Result Date: 05/07/2024 CLINICAL DATA:  Intraoperative cholangiogram EXAM: INTRAOPERATIVE CHOLANGIOGRAM TECHNIQUE: Cholangiographic images from the C-arm fluoroscopic device were submitted for interpretation post-operatively. Please see the procedural report for the amount of contrast and the fluoroscopy time utilized. FLUOROSCOPY: Radiation Exposure Index (as provided by the fluoroscopic device): 3.2 mGy Kerma COMPARISON:  Ultrasound 05/05/2019 FINDINGS: Two intraoperative spot views of the upper abdomen. Cholecystectomy clips noted. Cannulation of cystic duct. Injection of contrast opacifies the common hepatic duct and common bile duct. Contrast flows into the duodenum. No filling defects to suggest choledocholithiasis. NG tube in stomach IMPRESSION: No evidence of choledocholithiasis. Electronically Signed   By: Jackquline  Malva M.D.   On: 05/07/2024 13:19   US  ABDOMEN LIMITED RUQ (LIVER/GB) Result Date: 05/06/2024 CLINICAL DATA:  Cholecystitis. EXAM: ULTRASOUND ABDOMEN LIMITED RIGHT UPPER QUADRANT COMPARISON:  CT abdomen pelvis dated 05/06/2024. FINDINGS: Gallbladder: Multiple gallstones. The gallbladder wall is mildly thickened measuring 4 mm. No pericholecystic fluid. Negative sonographic Murphy's sign reported. Common bile duct: Diameter: 6 mm Liver: There is  diffuse increased liver echogenicity most commonly seen in the setting of fatty infiltration. Superimposed inflammation or fibrosis is not excluded. Clinical correlation is recommended. Portal vein is patent on color Doppler imaging with normal direction of blood flow towards the liver. Other: None. IMPRESSION: 1. Cholelithiasis with mild gallbladder wall thickening but no other sonographic features of acute cholecystitis. A hepatobiliary scintigraphy may provide better evaluation of the gallbladder if there is a high clinical concern for acute cholecystitis . 2. Fatty liver. Electronically Signed   By: Vanetta Chou M.D.   On: 05/06/2024 15:56   CT ABDOMEN PELVIS W CONTRAST Result Date: 05/06/2024 CLINICAL DATA:  Provided history: Abdominal pain, acute, nonlocalized Bowel obstruction suspected EXAM: CT ABDOMEN AND PELVIS WITH CONTRAST TECHNIQUE: Multidetector CT imaging of the abdomen and pelvis was performed using the standard protocol following bolus administration of intravenous contrast. RADIATION DOSE REDUCTION: This exam was performed according to the departmental dose-optimization program which includes automated exposure control, adjustment of the mA and/or kV according to patient size and/or use of iterative reconstruction technique. CONTRAST:  OMNIPAQUE  IOHEXOL  300 MG/ML  SOLN COMPARISON:  CT 10/23/2023, additional priors FINDINGS: Lower chest: Stable nodular area in the periphery of the right lower lobe series 5, image 24 measuring 11 mm, demonstrating long-term stability, favoring scarring. No pleural effusion. Hepatobiliary: Borderline hepatic steatosis. There is gallbladder wall thickening and pericholecystic fat stranding. No abnormal gallbladder distension. No calcified gallstone. Common bile duct is mildly dilated at 8 mm, previously 7 mm. Pancreas: No ductal dilatation or inflammation. Spleen: Normal in size without focal abnormality. Splenule at the hilum. Adrenals/Urinary Tract: No  adrenal nodule. No hydronephrosis or perinephric edema. Homogeneous renal enhancement with symmetric excretion on delayed phase imaging. Urinary bladder is partially distended without wall thickening. Stomach/Bowel: Minimal hiatal hernia. Mild pre pyloric gastric wall thickening, series 2, image 33 no small bowel obstruction or inflammation. Normal appendix. Small to moderate volume of stool in the colon. No colonic inflammation. Vascular/Lymphatic: No acute vascular findings. Normal caliber abdominal aorta. Portal vein is patent. No abdominopelvic adenopathy. Reproductive: Tubular cystic density in the right adnexa, representative measurement 5.5 cm in length series 2, image 76, is unchanged from 2015 exam, considered benign. No specific imaging follow-up is needed. Hysterectomy. Other: No free air or free fluid.  No abdominal wall hernia. Musculoskeletal: There are no acute or suspicious osseous abnormalities. L5-S1 degenerative disc disease. IMPRESSION: 1. Gallbladder wall thickening and pericholecystic fat stranding, suspicious for acute cholecystitis. No calcified gallstone. Recommend correlation with right upper quadrant ultrasound. 2. Mild common bile duct dilatation at 8 mm, previously 7 mm. Recommend correlation with LFTs. 3. Mild pre pyloric gastric wall thickening, can be seen with gastritis. 4. Borderline hepatic steatosis. Electronically Signed   By: Andrea Gasman M.D.   On: 05/06/2024 14:50    Anti-infectives: Anti-infectives (From admission, onward)    Start     Dose/Rate Route Frequency Ordered Stop   05/06/24 1700  cefTRIAXone  (ROCEPHIN ) 2 g in sodium chloride  0.9 % 100 mL IVPB  Status:  Discontinued        2 g 200 mL/hr over 30  Minutes Intravenous Every 24 hours 05/06/24 1611 05/07/24 1247        Assessment/Plan POD1: S/P laparoscopic cholecystectomy with intraoperative cholangiogram due to cholecystitis completed by Dr. Herlene Bureau on 05/07/2024. -WBC from 7/23  8.0 -Hemoglobin from 7/23 10.2 -Afebrile. No hypotension. Exam is benign -Tolerated modified carb diet -Patient stable for discharge. -Discussed post-operative restrictions. Provided return to work letter -Will arrange for patient to have outpatient follow up in 3-4 weeks and provide instructions.    FEN: Modified carb VTE: SCDs ID: None    Marjorie Carlyon Favre, [facility] 05/08/2024, 8:12 AM Please see Amion for pager number during day hours 7:00am-4:30pm

## 2024-05-08 NOTE — Plan of Care (Signed)
  Problem: Activity: Goal: Risk for activity intolerance will decrease Outcome: Adequate for Discharge   

## 2024-05-09 NOTE — Discharge Summary (Signed)
 [facility] Discharge Summary   Patient ID: Lessa, Huge MRN: [id number] DOB/AGE: 1971/03/01 53 y.o.  Admit date: 05/06/2024 Discharge date: 05/08/2024  Admitting Diagnosis: Cholecystitis Intractable nausea and vomiting Gastritis   Discharge Diagnosis Patient Active Problem List   Diagnosis Date Noted   Cholecystitis 05/06/2024   Cough 11/14/2016   Multiple pulmonary nodules 05/22/2014    Consultants General surgery  Imaging: 05/06/2024 CT Abdomen Pelvis W Contrast 05/06/2024 RUQ US  Abdomen Limited 05/07/2024 DG Cholangiogram Operative  Procedures Dr. Herlene Kinsinger (05/07/24) - Laparoscopic Cholecystectomy with [facility]  Hospital Course:  Gricel Copen is a 53 year old female who presented to the ED with abdominal pain, nausea, and chills. Workup showed acute cholecystitis and inflammation that can be see with gastritis.  Patient was admitted and underwent procedure listed above.  Tolerated procedure well and was transferred to the floor.  Diet was advanced as tolerated.  On POD 1, the patient was voiding well, tolerating diet, ambulating well, pain well controlled, vital signs stable, incisions c/d/i and felt stable for discharge home.  Patient will follow up in our office as below. She will call to confirm appointment date/time.    Physical Exam: General:  Alert, NAD, pleasant, comfortable Lungs: Respiratory effort nonlabored Abd: Soft and ND. Mild generalized tenderness of abdomen. Incisions C/D/I. No rebounding or guarding. Psych: A&Ox3 with an appropriate affect.   I or a member of my team have reviewed this patient in the Controlled Substance Database.   Allergies as of 05/08/2024       Reactions   Codeine Swelling, Hives, Other (See Comments)   Hard to swallow Other Reaction(s): Not available Hard to swallow     Hard to swallow Hard to swallow   Hard to swallow    Hard to swallow   Succinylcholine Other (See Comments)   Hard to wake  up from surgery Other Reaction(s): Not available Hard to wake up from surgery     Hard to wake up from surgery Hard to wake up from surgery   Hard to wake up from surgery    Hard to wake up from surgery        Medication List     TAKE these medications    acetaminophen  500 MG tablet Commonly known as: TYLENOL  Take 2 tablets (1,000 mg total) by mouth every 6 (six) hours.   Advil  200 MG Caps Generic drug: Ibuprofen  Take 400 mg by mouth every 6 (six) hours as needed (for pain).   Back & Body Extra Strength 500-32.5 MG Tabs Generic drug: Aspirin-Caffeine Take 1-2 tablets by mouth every 6 (six) hours as needed (for pain).   Dotti 0.075 MG/24HR Generic drug: estradiol Place 1 patch onto the skin See admin instructions. Apply 1 new patch to the skin on Sundays and Thursdays   gabapentin 300 MG capsule Commonly known as: NEURONTIN Take 300 mg by mouth at bedtime.   HYDROcodone -acetaminophen  10-325 MG tablet Commonly known as: NORCO Take 1 tablet by mouth See admin instructions. Take 1 tablet by mouth every four to six hours as needed for pain   methocarbamol  500 MG tablet Commonly known as: ROBAXIN  Take 500 mg by mouth every 8 (eight) hours as needed for muscle spasms.   Myrbetriq  50 MG Tb24 tablet Generic drug: mirabegron  ER Take 50 mg by mouth daily.   oxybutynin 10 MG 24 hr tablet Commonly known as: DITROPAN-XL Take 10 mg by mouth at bedtime as needed (as directed).   oxyCODONE  5 MG immediate  release tablet Commonly known as: Oxy IR/ROXICODONE  Take 1 tablet (5 mg total) by mouth every 6 (six) hours as needed for moderate pain (pain score 4-6) or severe pain (pain score 7-10).   pantoprazole  40 MG tablet Commonly known as: PROTONIX  Take 40 mg by mouth daily before breakfast.   prochlorperazine  10 MG tablet Commonly known as: COMPAZINE  Take 1 tablet (10 mg total) by mouth every 6 (six) hours as needed for nausea or vomiting (Use for nausea and / or vomiting  unresolved with ondansetron  (Zofran ).).   Repatha 140 MG/ML Sosy Generic drug: Evolocumab Inject 140 mg into the skin every 14 (fourteen) days.   venlafaxine  XR 75 MG 24 hr capsule Commonly known as: EFFEXOR -XR Take 75 mg by mouth daily with breakfast.   Vitamin D-3 125 MCG (5000 UT) Tabs Take 5,000 Units by mouth in the morning.          Follow-up Information     Maczis, Puja Gosai, PA-C. Go on 06/05/2024.   Specialty: General Surgery Why: Appointment at 2:30 Contact information: [address] [place] [address] [facility] [place] [address] [phone]                 Signed: Marjorie Carlyon Edmundo DEVONNA Shannon [facility] 05/09/2024, 12:24 PM Please see Amion for pager number during day hours 7:00am-4:30pm

## 2024-05-09 NOTE — Anesthesia Postprocedure Evaluation (Signed)
 Anesthesia Post Note  Patient: [name], [name]  Procedure(s) Performed: LAPAROSCOPIC CHOLECYSTECTOMY WITH INTRAOPERATIVE CHOLANGIOGRAM     Patient location during evaluation: PACU Anesthesia Type: General Level of consciousness: awake and alert Pain management: pain level controlled Vital Signs Assessment: post-procedure vital signs reviewed and stable Respiratory status: spontaneous breathing, nonlabored ventilation, respiratory function stable and patient connected to nasal cannula oxygen Cardiovascular status: blood pressure returned to baseline and stable Postop Assessment: no apparent nausea or vomiting Anesthetic complications: no   No notable events documented.  Last Vitals:  Vitals:   05/08/24 0202 05/08/24 0601  BP: 120/66 126/68  Pulse: 74 68  Resp: 18 18  Temp: 36.8 C 36.7 C  SpO2: 97% 99%    Last Pain:  Vitals:   05/08/24 1213  TempSrc:   PainSc: 3    Pain Goal: Patients Stated Pain Goal: 2 (05/08/24 1100)                 Sulamita Lafountain L Aneesh Faller

## 2024-09-02 ENCOUNTER — Other Ambulatory Visit (HOSPITAL_COMMUNITY): Payer: Self-pay | Admitting: Internal Medicine

## 2024-09-02 ENCOUNTER — Telehealth (HOSPITAL_COMMUNITY): Payer: Self-pay

## 2024-09-02 DIAGNOSIS — D649 Anemia, unspecified: Secondary | ICD-10-CM | POA: Insufficient documentation

## 2024-09-02 DIAGNOSIS — D509 Iron deficiency anemia, unspecified: Secondary | ICD-10-CM | POA: Insufficient documentation

## 2024-09-02 NOTE — Telephone Encounter (Signed)
 Auth Submission: NO AUTH NEEDED Site of care: Site of care: [facility] Payer: [employer] Medication & CPT/J Code(s) submitted: Venofer (Iron Sucrose) J1756 Diagnosis Code: D64.9, D50.9 Route of submission (phone, fax, portal):  Phone # Fax # Auth type: Buy/Bill HB Units/visits requested: 200mg  x 2 doses Reference number:  Approval from: 09/02/24 to 10/15/24
